# Patient Record
Sex: Male | Born: 1960 | Race: White | Hispanic: No | Smoking: Never smoker
Health system: Southern US, Community
[De-identification: ages and names within clinical notes are randomized; demographics above are authoritative.]

## PROBLEM LIST (undated history)

## (undated) DIAGNOSIS — K7460 Unspecified cirrhosis of liver: Secondary | ICD-10-CM

## (undated) DIAGNOSIS — K219 Gastro-esophageal reflux disease without esophagitis: Secondary | ICD-10-CM

## (undated) HISTORY — PX: ESOPHAGEAL DILATION: SHX303

## (undated) HISTORY — DX: Unspecified cirrhosis of liver: K74.60

## (undated) HISTORY — PX: BACK SURGERY: SHX140

## (undated) HISTORY — PX: CORONARY ANGIOPLASTY: SHX604

---

## 1998-03-31 ENCOUNTER — Emergency Department (HOSPITAL_COMMUNITY): Admission: EM | Admit: 1998-03-31 | Discharge: 1998-03-31 | Payer: Self-pay | Admitting: Emergency Medicine

## 1998-04-08 ENCOUNTER — Observation Stay (HOSPITAL_COMMUNITY): Admission: RE | Admit: 1998-04-08 | Discharge: 1998-04-09 | Payer: Self-pay | Admitting: Orthopedic Surgery

## 2005-01-27 ENCOUNTER — Observation Stay (HOSPITAL_COMMUNITY): Admission: RE | Admit: 2005-01-27 | Discharge: 2005-01-28 | Payer: Self-pay | Admitting: Orthopedic Surgery

## 2005-01-31 ENCOUNTER — Inpatient Hospital Stay (HOSPITAL_COMMUNITY): Admission: EM | Admit: 2005-01-31 | Discharge: 2005-02-02 | Payer: Self-pay | Admitting: Emergency Medicine

## 2017-11-11 ENCOUNTER — Other Ambulatory Visit: Payer: Self-pay

## 2017-11-11 ENCOUNTER — Encounter (HOSPITAL_COMMUNITY): Payer: Self-pay | Admitting: Oncology

## 2017-11-11 ENCOUNTER — Ambulatory Visit (HOSPITAL_COMMUNITY)
Admission: EM | Admit: 2017-11-11 | Discharge: 2017-11-12 | Disposition: A | Discharge status: Home / Self Care | Payer: Managed Care, Other (non HMO) | Attending: Emergency Medicine | Admitting: Emergency Medicine

## 2017-11-11 DIAGNOSIS — T18128A Food in esophagus causing other injury, initial encounter: Secondary | ICD-10-CM | POA: Diagnosis present

## 2017-11-11 DIAGNOSIS — Z79899 Other long term (current) drug therapy: Secondary | ICD-10-CM | POA: Insufficient documentation

## 2017-11-11 DIAGNOSIS — X58XXXA Exposure to other specified factors, initial encounter: Secondary | ICD-10-CM | POA: Insufficient documentation

## 2017-11-11 DIAGNOSIS — K449 Diaphragmatic hernia without obstruction or gangrene: Secondary | ICD-10-CM | POA: Diagnosis not present

## 2017-11-11 DIAGNOSIS — K2970 Gastritis, unspecified, without bleeding: Secondary | ICD-10-CM | POA: Insufficient documentation

## 2017-11-11 DIAGNOSIS — Z9861 Coronary angioplasty status: Secondary | ICD-10-CM | POA: Diagnosis not present

## 2017-11-11 DIAGNOSIS — K210 Gastro-esophageal reflux disease with esophagitis: Secondary | ICD-10-CM | POA: Insufficient documentation

## 2017-11-11 DIAGNOSIS — K222 Esophageal obstruction: Secondary | ICD-10-CM | POA: Diagnosis present

## 2017-11-11 NOTE — ED Triage Notes (Signed)
Pt bib [facility] from home d/t foreign body in throat.  Per EMS pt had swallowed a piece of steak wrong.  Pt has hx of esophageal dilation.  Pt is vomiting water.  Pt can speak in full sentences.

## 2017-11-12 ENCOUNTER — Emergency Department (HOSPITAL_COMMUNITY): Payer: Managed Care, Other (non HMO) | Admitting: Certified Registered"

## 2017-11-12 ENCOUNTER — Encounter (HOSPITAL_COMMUNITY)
Admission: EM | Disposition: A | Discharge status: Home / Self Care | Payer: Self-pay | Source: Home / Self Care | Attending: Emergency Medicine

## 2017-11-12 ENCOUNTER — Encounter (HOSPITAL_COMMUNITY): Payer: Self-pay | Admitting: *Deleted

## 2017-11-12 HISTORY — PX: ESOPHAGOGASTRODUODENOSCOPY (EGD) WITH PROPOFOL: SHX5813

## 2017-11-12 LAB — I-STAT CHEM 8, ED
BUN: 6 mg/dL (ref 6–20)
CALCIUM ION: 1.08 mmol/L — AB (ref 1.15–1.40)
Chloride: 106 mmol/L (ref 101–111)
Creatinine, Ser: 0.9 mg/dL (ref 0.61–1.24)
Glucose, Bld: 95 mg/dL (ref 65–99)
HCT: 48 % (ref 39.0–52.0)
Hemoglobin: 16.3 g/dL (ref 13.0–17.0)
Potassium: 3.7 mmol/L (ref 3.5–5.1)
SODIUM: 145 mmol/L (ref 135–145)
TCO2: 22 mmol/L (ref 22–32)

## 2017-11-12 SURGERY — ESOPHAGOGASTRODUODENOSCOPY (EGD) WITH PROPOFOL
Anesthesia: Monitor Anesthesia Care

## 2017-11-12 MED ORDER — SODIUM CHLORIDE 0.9 % IV SOLN: INTRAVENOUS | Status: DC

## 2017-11-12 MED ORDER — PROPOFOL 10 MG/ML IV BOLUS
INTRAVENOUS | Status: DC | PRN
  Administered 2017-11-12: 20 mg via INTRAVENOUS
  Administered 2017-11-12 (×2): 10 mg via INTRAVENOUS
  Administered 2017-11-12 (×2): 20 mg via INTRAVENOUS
  Administered 2017-11-12: 30 mg via INTRAVENOUS

## 2017-11-12 MED ORDER — LACTATED RINGERS IV SOLN
INTRAVENOUS | Status: DC
  Administered 2017-11-12: 10:00:00 via INTRAVENOUS

## 2017-11-12 MED ORDER — GLUCAGON HCL RDNA (DIAGNOSTIC) 1 MG IJ SOLR
1.0000 mg | Freq: Once | INTRAMUSCULAR | Status: AC
  Administered 2017-11-12: 1 mg via INTRAVENOUS
  Filled 2017-11-12: qty 1

## 2017-11-12 MED ORDER — ONDANSETRON HCL 4 MG/2ML IJ SOLN
4.0000 mg | Freq: Once | INTRAMUSCULAR | Status: AC
  Administered 2017-11-12: 4 mg via INTRAVENOUS
  Filled 2017-11-12: qty 2

## 2017-11-12 MED ORDER — SODIUM CHLORIDE 0.9 % IV BOLUS (SEPSIS)
500.0000 mL | Freq: Once | INTRAVENOUS | Status: AC
  Administered 2017-11-12: 500 mL via INTRAVENOUS

## 2017-11-12 MED ORDER — PROPOFOL 500 MG/50ML IV EMUL
INTRAVENOUS | Status: DC | PRN
  Administered 2017-11-12: 75 ug/kg/min via INTRAVENOUS

## 2017-11-12 MED ORDER — NITROGLYCERIN 0.4 MG SL SUBL
0.4000 mg | SUBLINGUAL_TABLET | Freq: Once | SUBLINGUAL | Status: AC
  Administered 2017-11-12: 0.4 mg via SUBLINGUAL
  Filled 2017-11-12: qty 1

## 2017-11-12 SURGICAL SUPPLY — 15 items

## 2017-11-12 NOTE — ED Notes (Signed)
Re-paged GI 

## 2017-11-12 NOTE — ED Notes (Signed)
Pt transported to endo.  

## 2017-11-12 NOTE — Anesthesia Postprocedure Evaluation (Signed)
Anesthesia Post Note  Patient: Rodney Russell  Procedure(s) Performed: ESOPHAGOGASTRODUODENOSCOPY (EGD) WITH PROPOFOL (N/A )     Patient location during evaluation: Endoscopy Anesthesia Type: MAC Level of consciousness: awake Pain management: pain level controlled Vital Signs Assessment: post-procedure vital signs reviewed and stable Respiratory status: spontaneous breathing Cardiovascular status: stable Anesthetic complications: no    Last Vitals:  Vitals:   11/12/17 1026 11/12/17 1100  BP: (!) 146/92 (!) 142/78  Pulse: 84 89  Resp: 18 18  Temp:  36.7 C  SpO2: 94% 100%    Last Pain:  Vitals:   11/12/17 1100  TempSrc:   PainSc: 0-No pain                 Deari Sessler

## 2017-11-12 NOTE — Discharge Instructions (Signed)
Do not drive till tomorrow no sharp objects clear liquids for 2 hours and if doing well may have soft solids today and slowly advance diet tomorrow but take small bites and take time eating and chew food well and drink plenty of liquids

## 2017-11-12 NOTE — Op Note (Signed)
[facility] Patient Name: Rodney Russell Procedure Date : 11/12/2017 MRN: [id number] Attending MD: Vida Rigger , MD Date of Birth: Dec 17, 1960 CSN: [id number] Age: 57 Admit Type: Emergency Department Procedure:                Upper GI endoscopy Indications:              Dysphagia, Foreign body in the esophagus Providers:                Vida Rigger, MD, Roselie Awkward, RN, Madalyn Rob,                            Technician, Judithann Sauger, Technician, Dionne Bucy, CRNA Referring MD:              Medicines:                Propofol total dose 220 mg IV Complications:            No immediate complications. Estimated Blood Loss:     Estimated blood loss: none. Procedure:                Pre-Anesthesia Assessment:                           - Prior to the procedure, a History and Physical                            was performed, and patient medications and                            allergies were reviewed. The patient's tolerance of                            previous anesthesia was also reviewed. The risks                            and benefits of the procedure and the sedation                            options and risks were discussed with the patient.                            All questions were answered, and informed consent                            was obtained. Prior Anticoagulants: The patient has                            taken no previous anticoagulant or antiplatelet                            agents. ASA Grade Assessment: I - A normal, healthy  patient. After reviewing the risks and benefits,                            the patient was deemed in satisfactory condition to                            undergo the procedure.                           - Prior to the procedure, a History and Physical                            was performed, and patient medications and                            allergies  were reviewed. The patient's tolerance of                            previous anesthesia was also reviewed. The risks                            and benefits of the procedure and the sedation                            options and risks were discussed with the patient.                            All questions were answered, and informed consent                            was obtained. Prior Anticoagulants: The patient has                            taken no previous anticoagulant or antiplatelet                            agents. ASA Grade Assessment: I - A normal, healthy                            patient. After reviewing the risks and benefits,                            the patient was deemed in satisfactory condition to                            undergo the procedure.                           After obtaining informed consent, the endoscope was                            passed under direct vision. Throughout the  procedure, the patient's blood pressure, pulse, and                            oxygen saturations were monitored continuously. The                            EG-[id number] ([id number]) scope was introduced through the                            mouth, and advanced to the second part of duodenum.                            The upper GI endoscopy was accomplished without                            difficulty. The patient tolerated the procedure                            well. Scope In: Scope Out: Findings:      A medium-sized hiatal hernia was present.      One mild benign-appearing, intrinsic stenosis was found. And was       traversed.      Moderate distal esophagitis with no bleeding was found.      Localized mild inflammation characterized by congestion (edema) and       erythema was found in the gastric antrum.      The duodenal bulb, first portion of the duodenum and second portion of       the duodenum were normal.      The exam was otherwise  without abnormality. Impression:               - Medium-sized hiatal hernia.                           - Benign-appearing esophageal stenosis.                           - Moderate distal reflux and erosive esophagitis.                           - Gastritis.                           - Normal duodenal bulb, first portion of the                            duodenum and second portion of the duodenum.                           - The examination was otherwise normal.                           - No specimens collected. Moderate Sedation:      moderate sedation-none Recommendation:           - Patient has a contact number available for  emergencies. The signs and symptoms of potential                            delayed complications were discussed with the                            patient. Return to normal activities tomorrow.                            Written discharge instructions were provided to the                            patient.                           - Clear liquid diet for 2 hours.then soft solids                            and chew food well take small bites and take time                            and drink plenty of liquids                           - Continue present medications.                           - Return to GI clinic in 2 weeks.                           - Telephone GI clinic if symptomatic PRN. Procedure Code(s):        --- Professional ---                           [phone], Esophagogastroduodenoscopy, flexible,                            transoral; diagnostic, including collection of                            specimen(s) by brushing or washing, when performed                            (separate procedure) Diagnosis Code(s):        --- Professional ---                           K44.9, Diaphragmatic hernia without obstruction or                            gangrene                           K22.2, Esophageal obstruction  K21.0, Gastro-esophageal reflux disease with                            esophagitis                           K20.8, Other esophagitis                           K29.70, Gastritis, unspecified, without bleeding                           R13.10, Dysphagia, unspecified                           T18.108A, Unspecified foreign body in esophagus                            causing other injury, initial encounter CPT copyright 2016 [employer]. All rights reserved. The codes documented in this report are preliminary and upon coder review may  be revised to meet current compliance requirements. Vida Rigger, MD 11/12/2017 10:27:47 AM This report has been signed electronically. Number of Addenda: 0

## 2017-11-12 NOTE — ED Notes (Signed)
PT angry about length of visit and delay for procedure.

## 2017-11-12 NOTE — Anesthesia Preprocedure Evaluation (Addendum)
Anesthesia Evaluation  Patient identified by MRN, date of birth, ID band Patient awake    Reviewed: Allergy & Precautions, NPO status , Patient's Chart, lab work & pertinent test results  Airway Mallampati: II  TM Distance: >3 FB     Dental   Pulmonary neg pulmonary ROS,    breath sounds clear to auscultation       Cardiovascular + CAD   Rhythm:Regular Rate:Normal  History noted. CG   Neuro/Psych    GI/Hepatic Neg liver ROS, History noted. CG   Endo/Other  negative endocrine ROS  Renal/GU negative Renal ROS     Musculoskeletal   Abdominal   Peds  Hematology   Anesthesia Other Findings   Reproductive/Obstetrics                            Anesthesia Physical Anesthesia Plan  ASA: III  Anesthesia Plan: MAC   Post-op Pain Management:    Induction: Intravenous  PONV Risk Score and Plan: 2 and Treatment may vary due to age or medical condition  Airway Management Planned: Simple Face Mask, Mask and Nasal Cannula  Additional Equipment:   Intra-op Plan:   Post-operative Plan:   Informed Consent: I have reviewed the patients History and Physical, chart, labs and discussed the procedure including the risks, benefits and alternatives for the proposed anesthesia with the patient or authorized representative who has indicated his/her understanding and acceptance.   Dental advisory given  Plan Discussed with: CRNA and Anesthesiologist  Anesthesia Plan Comments:        Anesthesia Quick Evaluation

## 2017-11-12 NOTE — ED Provider Notes (Signed)
[facility]   CSN: [id number] Arrival date & time: 11/11/17  2341     History   Chief Complaint Chief Complaint  Patient presents with  . Aspiration    HPI Rodney Russell is a 57 y.o. male.  57 yo M with a chief complaint of a food impaction.  Patient said he had a large piece of steak and felt to get stuck.  He has had some vomiting but has only gotten clear things up but no food.  He feels that if someone does the Heimlich to him that he would feel better.  He has had to have his esophagus dilated in the past.  This was done in [place], patient has tried to drink water without avail.  He also tried to take Zantac but he was unable to keep it down.  He has been on Prilosec chronically but has been taking it every other day.   The history is provided by the patient and the spouse.  Illness  This is a new problem. The current episode started 3 to 5 hours ago. The problem occurs constantly. The problem has not changed since onset.Pertinent negatives include no chest pain, no abdominal pain, no headaches and no shortness of breath. Nothing aggravates the symptoms. Nothing relieves the symptoms. He has tried nothing for the symptoms. The treatment provided no relief.    History reviewed. No pertinent past medical history.  There are no active problems to display for this patient.   Past Surgical History:  Procedure Laterality Date  . BACK SURGERY    . CORONARY ANGIOPLASTY    . ESOPHAGEAL DILATION     x 2       Home Medications    Prior to Admission medications   Medication Sig Start Date End Date Taking? Authorizing Provider  pantoprazole (PROTONIX) 40 MG tablet Take 40 mg by mouth daily.   Yes [provider]    Family History No family history on file.  Social History Social History   Tobacco Use  . Smoking status: Never Smoker  . Smokeless tobacco: Never Used  Substance Use Topics  . Alcohol  use: Yes  . Drug use: No     Allergies   Patient has no known allergies.   Review of Systems Review of Systems  Constitutional: Negative for chills and fever.  HENT: Negative for congestion and facial swelling.   Eyes: Negative for discharge and visual disturbance.  Respiratory: Negative for shortness of breath.   Cardiovascular: Negative for chest pain and palpitations.  Gastrointestinal: Positive for nausea and vomiting. Negative for abdominal pain and diarrhea.  Musculoskeletal: Negative for arthralgias and myalgias.  Skin: Negative for color change and rash.  Neurological: Negative for tremors, syncope and headaches.  Psychiatric/Behavioral: Negative for confusion and dysphoric mood.     Physical Exam Updated Vital Signs BP 134/88   Pulse 88   Temp 98.1 F (36.7 C) (Oral)   Resp 14   Ht 5\' 8"  (1.727 m)   Wt 99.8 kg (220 lb)   SpO2 94%   BMI 33.45 kg/m   Physical Exam  Constitutional: He is oriented to person, place, and time. He appears well-developed and well-nourished.  HENT:  Head: Normocephalic and atraumatic.  Eyes: EOM are normal. Pupils are equal, round, and reactive to light.  Neck: Normal range of motion. Neck supple. No JVD present.  Cardiovascular: Normal rate and regular rhythm. Exam reveals no gallop and no friction rub.  No murmur heard. Pulmonary/Chest: No respiratory distress. He has no wheezes.  Abdominal: He exhibits no distension and no mass. There is no tenderness. There is no rebound and no guarding.  Musculoskeletal: Normal range of motion.  Neurological: He is alert and oriented to person, place, and time.  Skin: No rash noted. No pallor.  Psychiatric: He has a normal mood and affect. His behavior is normal.  Nursing note and vitals reviewed.    ED Treatments / Results  Labs (all labs ordered are listed, but only abnormal results are displayed) Labs Reviewed - No data to display  EKG  EKG Interpretation  Date/Time:  Saturday  November 11 2017 23:46:08 EST Ventricular Rate:  72 PR Interval:    QRS Duration: 94 QT Interval:  397 QTC Calculation: 435 R Axis:   -85 Text Interpretation:  Sinus rhythm Borderline prolonged PR interval Left anterior fascicular block Abnormal R-wave progression, late transition Baseline wander in lead(s) II No significant change since last tracing Confirmed by Melene Plan [phone]) on 11/12/2017 12:00:28 AM       Radiology No results found.  Procedures Procedures (including critical care time)  Medications Ordered in ED Medications  nitroGLYCERIN (NITROSTAT) SL tablet 0.4 mg (0.4 mg Sublingual Given 11/12/17 0119)  glucagon (human recombinant) (GLUCAGEN) injection 1 mg (1 mg Intravenous Given 11/12/17 0119)  ondansetron (ZOFRAN) injection 4 mg (4 mg Intravenous Given 11/12/17 0119)  glucagon (human recombinant) (GLUCAGEN) injection 1 mg (1 mg Intravenous Given 11/12/17 0350)     Initial Impression / Assessment and Plan / ED Course  I have reviewed the triage vital signs and the nursing notes.  Pertinent labs & imaging results that were available during my care of the patient were reviewed by me and considered in my medical decision making (see chart for details).     57 yO M with a chief complaint of an esophageal food impaction.  Patient is well-appearing and nontoxic.  There is no airway involvement.  He has clear lung sounds.  We will give the patient some nitro glucagon and reassess.  The patient is still not able to tolerate liquids.  I discussed the case with Dr. Ewing Schlein, he has to be re-paged at 07 if the patient has not yet passed his food bolus.  The patient was reassessed and is unable to tolerate liquids.  I rediscussed the case with Dr. Ewing Schlein, he will call in the endoscopy team this morning.  The patients results and plan were reviewed and discussed.   Any x-rays performed were independently reviewed by myself.   Differential diagnosis were considered with the presenting  HPI.  Medications  nitroGLYCERIN (NITROSTAT) SL tablet 0.4 mg (0.4 mg Sublingual Given 11/12/17 0119)  glucagon (human recombinant) (GLUCAGEN) injection 1 mg (1 mg Intravenous Given 11/12/17 0119)  ondansetron (ZOFRAN) injection 4 mg (4 mg Intravenous Given 11/12/17 0119)  glucagon (human recombinant) (GLUCAGEN) injection 1 mg (1 mg Intravenous Given 11/12/17 0350)    Vitals:   11/12/17 0315 11/12/17 0500 11/12/17 0530 11/12/17 0545  BP: (!) 144/95 (!) 151/99 128/88 134/88  Pulse: 77 81 81 88  Resp: 15 (!) 26 16 14   Temp:      TempSrc:      SpO2: 95% 97% 94% 94%  Weight:      Height:        Final diagnoses:  Esophageal obstruction due to food impaction      Final Clinical Impressions(s) / ED Diagnoses   Final diagnoses:  Esophageal obstruction due  to food impaction    ED Discharge Orders    None       Melene Plan, [place] 11/12/17 [phone]

## 2017-11-12 NOTE — Transfer of Care (Signed)
Immediate Anesthesia Transfer of Care Note  Patient: Rodney Russell  Procedure(s) Performed: ESOPHAGOGASTRODUODENOSCOPY (EGD) WITH PROPOFOL (N/A )  Patient Location: Endoscopy Unit  Anesthesia Type:MAC  Level of Consciousness: awake, alert , oriented and patient cooperative  Airway & Oxygen Therapy: Patient Spontanous Breathing and Patient connected to nasal cannula oxygen  Post-op Assessment: Report given to RN, Post -op Vital signs reviewed and stable and Patient moving all extremities X 4  Post vital signs: Reviewed and stable  Last Vitals:  Vitals:   11/12/17 0900 11/12/17 0938  BP: 107/81 (!) 166/105  Pulse: 85 78  Resp: 17 14  Temp:  37.1 C  SpO2: 91% 96%    Last Pain:  Vitals:   11/12/17 0938  TempSrc: Oral  PainSc:          Complications: No apparent anesthesia complications

## 2017-11-12 NOTE — Consult Note (Signed)
Reason for Consult: Food impaction Referring Physician: ER physician  Rodney Russell is an 57 y.o. male.  HPI: Patient seen and examined and he has a long history of swallowing problems and has had dilation a few times before and the last one was about 3 years ago and has had food impactions before but is always been able to force himself to vomit and get it up but this time he has been unable in the ER tried all the usual medicines without success and he has been on a stomach medicine but no other medical issues or medicines and has no known allergies  History reviewed. No pertinent past medical history.  Past Surgical History:  Procedure Laterality Date  . BACK SURGERY    . CORONARY ANGIOPLASTY    . ESOPHAGEAL DILATION     x 2    History reviewed. No pertinent family history.  Social History:  reports that  has never smoked. he has never used smokeless tobacco. He reports that he drinks alcohol. He reports that he does not use drugs.  Allergies: No Known Allergies  Medications: I have reviewed the patient's current medications.  Results for orders placed or performed during the hospital encounter of 11/11/17 (from the past 48 hour(s))  I-stat Chem 8, ED     Status: Abnormal   Collection Time: 11/12/17  9:29 AM  Result Value Ref Range   Sodium 145 135 - 145 mmol/L   Potassium 3.7 3.5 - 5.1 mmol/L   Chloride 106 101 - 111 mmol/L   BUN 6 6 - 20 mg/dL   Creatinine, Ser 5.630.90 0.61 - 1.24 mg/dL   Glucose, Bld 95 65 - 99 mg/dL   Calcium, Ion 8.751.08 (L) 1.15 - 1.40 mmol/L   TCO2 22 22 - 32 mmol/L   Hemoglobin 16.3 13.0 - 17.0 g/dL   HCT 64.348.0 32.939.0 - 51.852.0 %    No results found.  ROS negative except above Blood pressure (!) 166/105, pulse 78, temperature 98.7 F (37.1 C), temperature source Oral, resp. rate 14, height 5\' 8"  (1.727 m), weight 99.8 kg (220 lb), SpO2 96 %. Physical Exam vital signs stable afebrile no acute distress exam please see preassessment  evaluation  Assessment/Plan: Impaction in a patient with history of dilations in the past Plan: The risks benefits and methods of endoscopy was discussed with the patient and his sister and will proceed with anesthesia assistance this morning with further workup and plans pending those findings  Hideo Googe E 11/12/2017, 9:52 AM

## 2017-11-14 ENCOUNTER — Encounter (HOSPITAL_COMMUNITY): Payer: Self-pay | Admitting: Gastroenterology

## 2018-12-22 ENCOUNTER — Emergency Department: Payer: Self-pay | Admitting: Anesthesiology

## 2018-12-22 ENCOUNTER — Encounter
Admission: EM | Disposition: A | Discharge status: Home / Self Care | Payer: Self-pay | Source: Home / Self Care | Attending: Emergency Medicine

## 2018-12-22 ENCOUNTER — Other Ambulatory Visit: Payer: Self-pay

## 2018-12-22 ENCOUNTER — Emergency Department: Payer: Self-pay

## 2018-12-22 ENCOUNTER — Encounter: Payer: Self-pay | Admitting: Emergency Medicine

## 2018-12-22 ENCOUNTER — Emergency Department
Admission: EM | Admit: 2018-12-22 | Discharge: 2018-12-22 | Disposition: A | Discharge status: Home / Self Care | Payer: Self-pay | Attending: Emergency Medicine | Admitting: Emergency Medicine

## 2018-12-22 DIAGNOSIS — Z79899 Other long term (current) drug therapy: Secondary | ICD-10-CM | POA: Insufficient documentation

## 2018-12-22 DIAGNOSIS — K449 Diaphragmatic hernia without obstruction or gangrene: Secondary | ICD-10-CM | POA: Insufficient documentation

## 2018-12-22 DIAGNOSIS — T18108A Unspecified foreign body in esophagus causing other injury, initial encounter: Secondary | ICD-10-CM

## 2018-12-22 DIAGNOSIS — Z791 Long term (current) use of non-steroidal anti-inflammatories (NSAID): Secondary | ICD-10-CM | POA: Insufficient documentation

## 2018-12-22 DIAGNOSIS — T18128A Food in esophagus causing other injury, initial encounter: Secondary | ICD-10-CM | POA: Insufficient documentation

## 2018-12-22 DIAGNOSIS — I2510 Atherosclerotic heart disease of native coronary artery without angina pectoris: Secondary | ICD-10-CM | POA: Insufficient documentation

## 2018-12-22 DIAGNOSIS — K210 Gastro-esophageal reflux disease with esophagitis: Secondary | ICD-10-CM | POA: Insufficient documentation

## 2018-12-22 DIAGNOSIS — K222 Esophageal obstruction: Secondary | ICD-10-CM | POA: Insufficient documentation

## 2018-12-22 DIAGNOSIS — X58XXXA Exposure to other specified factors, initial encounter: Secondary | ICD-10-CM | POA: Insufficient documentation

## 2018-12-22 HISTORY — DX: Gastro-esophageal reflux disease without esophagitis: K21.9

## 2018-12-22 HISTORY — PX: ESOPHAGOGASTRODUODENOSCOPY: SHX5428

## 2018-12-22 SURGERY — EGD (ESOPHAGOGASTRODUODENOSCOPY)
Anesthesia: General

## 2018-12-22 MED ORDER — ONDANSETRON HCL 4 MG/2ML IJ SOLN
INTRAMUSCULAR | Status: DC | PRN
  Administered 2018-12-22: 4 mg via INTRAVENOUS

## 2018-12-22 MED ORDER — SODIUM CHLORIDE 0.9 % IV SOLN
Freq: Once | INTRAVENOUS | Status: AC
  Administered 2018-12-22: 18:00:00 via INTRAVENOUS

## 2018-12-22 MED ORDER — FENTANYL CITRATE (PF) 100 MCG/2ML IJ SOLN: 25.0000 ug | INTRAMUSCULAR | Status: DC | PRN

## 2018-12-22 MED ORDER — IPRATROPIUM-ALBUTEROL 0.5-2.5 (3) MG/3ML IN SOLN
RESPIRATORY_TRACT | Status: AC
  Administered 2018-12-22: 19:00:00 3 mL via RESPIRATORY_TRACT
  Filled 2018-12-22: qty 3

## 2018-12-22 MED ORDER — LIDOCAINE HCL (CARDIAC) PF 100 MG/5ML IV SOSY
PREFILLED_SYRINGE | INTRAVENOUS | Status: DC | PRN
  Administered 2018-12-22: 100 mg via INTRAVENOUS

## 2018-12-22 MED ORDER — LIDOCAINE HCL (PF) 2 % IJ SOLN
INTRAMUSCULAR | Status: AC
  Filled 2018-12-22: qty 10

## 2018-12-22 MED ORDER — OXYCODONE HCL 5 MG PO TABS: 5.0000 mg | ORAL_TABLET | Freq: Once | ORAL | Status: DC | PRN

## 2018-12-22 MED ORDER — OMEPRAZOLE 40 MG PO CPDR: 40.0000 mg | DELAYED_RELEASE_CAPSULE | Freq: Every day | ORAL | 2 refills | Status: DC

## 2018-12-22 MED ORDER — IPRATROPIUM-ALBUTEROL 0.5-2.5 (3) MG/3ML IN SOLN
3.0000 mL | RESPIRATORY_TRACT | Status: DC
  Administered 2018-12-22: 3 mL via RESPIRATORY_TRACT

## 2018-12-22 MED ORDER — SUCCINYLCHOLINE CHLORIDE 20 MG/ML IJ SOLN
INTRAMUSCULAR | Status: DC | PRN
  Administered 2018-12-22: 200 mg via INTRAVENOUS

## 2018-12-22 MED ORDER — FENTANYL CITRATE (PF) 100 MCG/2ML IJ SOLN
INTRAMUSCULAR | Status: DC | PRN
  Administered 2018-12-22: 50 ug via INTRAVENOUS

## 2018-12-22 MED ORDER — SUCCINYLCHOLINE CHLORIDE 20 MG/ML IJ SOLN
INTRAMUSCULAR | Status: AC
Start: 2018-12-22 — End: ?
  Filled 2018-12-22: qty 1

## 2018-12-22 MED ORDER — OXYCODONE HCL 5 MG/5ML PO SOLN: 5.0000 mg | Freq: Once | ORAL | Status: DC | PRN

## 2018-12-22 MED ORDER — LIDOCAINE VISCOUS HCL 2 % MT SOLN
15.0000 mL | Freq: Once | OROMUCOSAL | Status: AC
  Administered 2018-12-22: 15 mL via ORAL
  Filled 2018-12-22: qty 15

## 2018-12-22 MED ORDER — DEXAMETHASONE SODIUM PHOSPHATE 10 MG/ML IJ SOLN
INTRAMUSCULAR | Status: DC | PRN
  Administered 2018-12-22: 10 mg via INTRAVENOUS

## 2018-12-22 MED ORDER — FENTANYL CITRATE (PF) 100 MCG/2ML IJ SOLN
INTRAMUSCULAR | Status: AC
  Filled 2018-12-22: qty 2

## 2018-12-22 MED ORDER — PROPOFOL 10 MG/ML IV BOLUS
INTRAVENOUS | Status: DC | PRN
  Administered 2018-12-22: 50 mg via INTRAVENOUS
  Administered 2018-12-22: 180 mg via INTRAVENOUS

## 2018-12-22 MED ORDER — ALUM & MAG HYDROXIDE-SIMETH 200-200-20 MG/5ML PO SUSP
30.0000 mL | Freq: Once | ORAL | Status: AC
  Administered 2018-12-22: 14:00:00 30 mL via ORAL
  Filled 2018-12-22: qty 30

## 2018-12-22 MED ORDER — ROCURONIUM BROMIDE 50 MG/5ML IV SOLN
INTRAVENOUS | Status: AC
  Filled 2018-12-22: qty 1

## 2018-12-22 MED ORDER — SODIUM CHLORIDE 0.9 % IV SOLN
INTRAVENOUS | Status: DC | PRN
  Administered 2018-12-22: 18:00:00 via INTRAVENOUS

## 2018-12-22 NOTE — Consult Note (Addendum)
Arlyss Repressohini R Vanga, MD [address]  [address]  [place], [place] [address]  Main: [phone]  Fax: [phone] Pager: [phone]   Consultation  Referring Provider:     No ref. provider found Primary Care Physician:  System, Pcp Not In Primary Gastroenterologist:  Dr. Norma Fredricksonoledo         Reason for Consultation:     Food impaction  Date of Admission:  12/22/2018 Date of Consultation:  12/22/2018         HPI:   Rodney Russell is a 58 y.o. male with history of hiatal hernia, esophagitis, esophageal strictures, status post dilation in the past, had food bolus impaction in the past who ate a piece of steak at 6 AM today.  He generally tries to chew thoroughly when eating hard meats like deer meat or steak but, this morning he was rushing up to work, swallowed a piece of steak without chewing it well.  He tried to get it down at home by drinking warm Coke without success.  He tried to drink liquids, unable to keep them down.  He is able to swallow secretions, protecting his airway.  Patient reports that he had esophageal dilation about 7 years ago, food impaction echo 10 years ago which passed while doing the endoscopy.  Patient reports taking Protonix every other day, he currently does not have insurance and therefore trying to manage with the medication that he is carrying.  GI is consulted for an urgent EGD for food impaction  NSAIDs: Mobic  Antiplts/Anticoagulants/Anti thrombotics: None  GI Procedures: EGD in 11/2017 - Medium-sized hiatal hernia. - Benign-appearing esophageal stenosis. - Moderate distal reflux and erosive esophagitis. - Gastritis. - Normal duodenal bulb, first portion of the duodenum and second portion of the duodenum. - The examination was otherwise normal. - No specimens collected.  Past Medical History:  Diagnosis Date  . GERD (gastroesophageal reflux disease)     Past Surgical History:  Procedure Laterality Date  . BACK SURGERY    . CORONARY  ANGIOPLASTY    . ESOPHAGEAL DILATION     x 2  . ESOPHAGOGASTRODUODENOSCOPY (EGD) WITH PROPOFOL N/A 11/12/2017   Procedure: ESOPHAGOGASTRODUODENOSCOPY (EGD) WITH PROPOFOL;  Surgeon: Vida RiggerMagod, Marc, MD;  [facility];  Service: Endoscopy;  Laterality: N/A;    Prior to Admission medications   Medication Sig Start Date End Date Taking? Authorizing Provider  cyclobenzaprine (FLEXERIL) 10 MG tablet Take 10 mg by mouth 3 (three) times daily. 10/25/18   [provider]  meloxicam (MOBIC) 15 MG tablet Take 15 mg by mouth daily. 10/25/18   [provider]  pantoprazole (PROTONIX) 40 MG tablet Take 40 mg by mouth daily.    [provider]  No current facility-administered medications for this encounter.    History reviewed. No pertinent family history.   Social History   Tobacco Use  . Smoking status: Never Smoker  . Smokeless tobacco: Never Used  Substance Use Topics  . Alcohol use: Yes  . Drug use: No    Allergies as of 12/22/2018  . (No Known Allergies)    Review of Systems:    All systems reviewed and negative except where noted in HPI.   Physical Exam:  Vital signs in last 24 hours: Temp:  [98.9 F (37.2 C)] 98.9 F (37.2 C) (04/18 1358) Pulse Rate:  [88-98] 88 (04/18 1700) Resp:  [18] 18 (04/18 1358) BP: (154-166)/(106-112) 158/106 (04/18 1700) SpO2:  [93 %-97 %] 93 % (04/18  1700) Weight:  [99.3 kg] 99.3 kg (04/18 1354)   General:   Pleasant, cooperative in NAD Head:  Normocephalic and atraumatic. Eyes:   No icterus.   Conjunctiva pink. PERRLA. Ears:  Normal auditory acuity. Neck:  Supple; no masses or thyroidomegaly Lungs: Respirations even and unlabored. Lungs clear to auscultation bilaterally.   No wheezes, crackles, or rhonchi.  Heart:  Regular rate and rhythm;  Without murmur, clicks, rubs or gallops Abdomen:  Soft, nondistended, nontender. Normal bowel sounds. No appreciable masses or hepatomegaly.  No rebound or guarding.  Rectal:   Not performed. Msk:  Symmetrical without gross deformities.  Strength normal  Extremities:  Without edema, cyanosis or clubbing. Neurologic:  Alert and oriented x3;  grossly normal neurologically. Skin:  Intact without significant lesions or rashes. Psych:  Alert and cooperative. Normal affect.  LAB RESULTS: CBC Latest Ref Rng & Units 11/12/2017  Hemoglobin 13.0 - 17.0 g/dL 03.5  Hematocrit 00.9 - 52.0 % 48.0    BMET BMP Latest Ref Rng & Units 11/12/2017  Glucose 65 - 99 mg/dL 95  BUN 6 - 20 mg/dL 6  Creatinine 3.81 - 8.29 mg/dL 9.37  Sodium 169 - 678 mmol/L 145  Potassium 3.5 - 5.1 mmol/L 3.7  Chloride 101 - 111 mmol/L 106    LFT No flowsheet data found.   STUDIES: Dg Neck Soft Tissue  Result Date: 12/22/2018 CLINICAL DATA:  Steak stuck in throat. EXAM: NECK SOFT TISSUES - 1+ VIEW COMPARISON:  None. FINDINGS: Two-view exam shows no prevertebral soft tissue swelling. No gas evident within the prevertebral soft tissues. Epiglottis and aryepiglottic folds are unremarkable. Prominent anterior osteophytes around the C3-4 disc space anteriorly generate mass-effect on the posterior hypopharynx. IMPRESSION: Degenerative spurring in the upper cervical spine generates mass-effect on the posterior hypopharynx. Otherwise unremarkable. Electronically Signed   By: Kennith Center M.D.   On: 12/22/2018 15:13      Impression / Plan:   Rodney Russell is a 58 y.o. male with known history of esophagitis, esophageal stricture and hiatal hernia, prior food bolus impactions presented to ER with food impaction after eating piece of steak earlier today  Recommend urgent EGD with intubation Avoid NSAIDs Continue long-term PPI twice daily  I have discussed alternative options, risks & benefits,  which include, but are not limited to, bleeding, infection, perforation,respiratory complication & drug reaction.  The patient agrees with this plan & written consent will be obtained.     Thank you for  involving me in the care of this patient.      LOS: 0 days   Lannette Donath, MD  12/22/2018, 5:43 PM   Note: This dictation was prepared with Dragon dictation along with smaller phrase technology. Any transcriptional errors that result from this process are unintentional.

## 2018-12-22 NOTE — Anesthesia Postprocedure Evaluation (Signed)
Anesthesia Post Note  Patient: Rodney Russell  Procedure(s) Performed: ESOPHAGOGASTRODUODENOSCOPY (EGD) (N/A )  Patient location during evaluation: PACU Anesthesia Type: General Level of consciousness: awake and alert Pain management: pain level controlled Vital Signs Assessment: post-procedure vital signs reviewed and stable Respiratory status: spontaneous breathing, nonlabored ventilation and respiratory function stable Cardiovascular status: blood pressure returned to baseline and stable Postop Assessment: no apparent nausea or vomiting Anesthetic complications: no Comments: Patient saturating 96% while I was talking to him at time of this note.  He was 93% pre Op.  He reports no shortness of breath at this time.  Patient instructed to contact a doctor immediately or call 911 if he develops any new shortness of breath, chest pain or has other concerns.     Last Vitals:  Vitals:   12/22/18 1833 12/22/18 1848  BP: 132/88 128/88  Pulse: 89 91  Resp: 16 15  Temp:    SpO2: 93% 91%    Last Pain:  Vitals:   12/22/18 1848  TempSrc:   PainSc: 0-No pain                 Cleda Mccreedy Rona Tomson

## 2018-12-22 NOTE — ED Notes (Signed)
Pt taken to xray via stretcher  

## 2018-12-22 NOTE — Anesthesia Post-op Follow-up Note (Signed)
Anesthesia QCDR form completed.        

## 2018-12-22 NOTE — ED Triage Notes (Signed)
Pt got piece steak stuck in esophagus at 6 AM. Has tried to get to go down at home without success. Hx of same with endoscopy for same.  Maintaining airway. Liquids come back up.

## 2018-12-22 NOTE — Op Note (Signed)
[facility] Gastroenter[facility] Patient Name: Rodney Russell Procedure Date: 12/22/2018 5:45 PM MRN: [id number] Account #: [id number] Date of Birth: 01-14-1961 Admit Type: Inpatient Age: 58 Room: [facility] ENDO ROOM 4 Gender: Male Note Status: Finalized Procedure:            Upper GI endoscopy Indications:          Esophageal dysphagia Providers:            Toney Reil MD, MD Medicines:            See the Anesthesia note for documentation of the                        administered medications, General Anesthesia Complications:        No immediate complications. Estimated blood loss: None. Procedure:            Pre-Anesthesia Assessment:                       - Prior to the procedure, a History and Physical was                        performed, and patient medications and allergies were                        reviewed. The patient is competent. The risks and                        benefits of the procedure and the sedation options and                        risks were discussed with the patient. All questions                        were answered and informed consent was obtained.                        Patient identification and proposed procedure were                        verified by the physician, the nurse, the                        anesthesiologist, the anesthetist and the technician in                        the pre-procedure area in the procedure room in the                        endoscopy suite. Mental Status Examination: alert and                        oriented. Airway Examination: normal oropharyngeal                        airway and neck mobility. Respiratory Examination:                        clear to auscultation. CV Examination: normal.  Prophylactic Antibiotics: The patient does not require                        prophylactic antibiotics. Prior Anticoagulants: The                        patient has taken no previous  anticoagulant or                        antiplatelet agents. ASA Grade Assessment: III - A                        patient with severe systemic disease. After reviewing                        the risks and benefits, the patient was deemed in                        satisfactory condition to undergo the procedure. The                        anesthesia plan was to use general anesthesia.                        Immediately prior to administration of medications, the                        patient was re-assessed for adequacy to receive                        sedatives. The heart rate, respiratory rate, oxygen                        saturations, blood pressure, adequacy of pulmonary                        ventilation, and response to care were monitored                        throughout the procedure. The physical status of the                        patient was re-assessed after the procedure.                       After obtaining informed consent, the endoscope was                        passed under direct vision. Throughout the procedure,                        the patient's blood pressure, pulse, and oxygen                        saturations were monitored continuously. The Endoscope                        was introduced through the mouth, and advanced to the  second part of duodenum. The upper GI endoscopy was                        accomplished without difficulty. The patient tolerated                        the procedure well. Findings:      Food was found in the lower third of the esophagus. Removal of food was       accomplished, pushed into stomach.      LA Grade C (one or more mucosal breaks continuous between tops of 2 or       more mucosal folds, less than 75% circumference) esophagitis with no       bleeding was found in the lower third of the esophagus.      The entire examined stomach was normal.      A medium-sized hiatal hernia was present.      The  duodenal bulb and second portion of the duodenum were normal. Impression:           - Food in the lower third of the esophagus. Removal was                        successful.                       - LA Grade C reflux esophagitis.                       - Normal stomach.                       - Medium-sized hiatal hernia.                       - Normal duodenal bulb and second portion of the                        duodenum. Recommendation:       - Discharge patient to home (with escort).                       - Chopped diet for the rest of the patient's life.                       - Follow an antireflux regimen.                       - Use Prilosec (omeprazole) 40 mg PO daily indefinitely.                       - Await pathology results.                       - Follow up Dr Norma Fredricksonoledo Procedure Code(s):    --- Professional ---                       [phone], Esophagogastroduodenoscopy, flexible, transoral;                        with removal of foreign body(s) Diagnosis Code(s):    --- Professional ---  G50.037C, Food in esophagus causing other injury,                        initial encounter                       K21.0, Gastro-esophageal reflux disease with esophagitis                       K44.9, Diaphragmatic hernia without obstruction or                        gangrene                       R13.14, Dysphagia, pharyngoesophageal phase CPT copyright 2019 [employer]. All rights reserved. The codes documented in this report are preliminary and upon coder review may  be revised to meet current compliance requirements. Dr. Libby Maw Toney Reil MD, MD 12/22/2018 6:14:26 PM This report has been signed electronically. Number of Addenda: 0 Note Initiated On: 12/22/2018 5:45 PM      [facility]

## 2018-12-22 NOTE — ED Notes (Signed)
Pt presents to ED c/o of steak stuck in throat since 6am this AM. Hx of same. States tried drinking warm coke and was unable to keep it down. Talking, oxygen saturations in high 90's. No resp distress noted at this time.

## 2018-12-22 NOTE — Progress Notes (Signed)
Will continue to monitor oxygen level and hopeful discharge.  Will notify Dr. Randa Ngo.

## 2018-12-22 NOTE — ED Notes (Addendum)
Pt hit call bell to inform this RN that medications had come back up. Informed provider.

## 2018-12-22 NOTE — Transfer of Care (Signed)
Immediate Anesthesia Transfer of Care Note  Patient: Rodney Russell  Procedure(s) Performed: ESOPHAGOGASTRODUODENOSCOPY (EGD) (N/A )  Patient Location: PACU  Anesthesia Type:General  Level of Consciousness: awake  Airway & Oxygen Therapy: Patient Spontanous Breathing  Post-op Assessment: Report given to RN and Post -op Vital signs reviewed and stable  Post vital signs: Reviewed and stable  Last Vitals:  Vitals Value Taken Time  BP 132/91 12/22/2018  6:18 PM  Temp    Pulse 99 12/22/2018  6:18 PM  Resp 24 12/22/2018  6:18 PM  SpO2 91 % 12/22/2018  6:18 PM  Vitals shown include unvalidated device data.  Last Pain:  Vitals:   12/22/18 1358  TempSrc: Oral  PainSc:          Complications: No apparent anesthesia complications

## 2018-12-22 NOTE — Anesthesia Preprocedure Evaluation (Signed)
Anesthesia Evaluation  Patient identified by MRN, date of birth, ID band Patient awake    Reviewed: Allergy & Precautions, H&P , NPO status , Patient's Chart, lab work & pertinent test results  History of Anesthesia Complications Negative for: history of anesthetic complications  Airway Mallampati: II  TM Distance: >3 FB Neck ROM: limited    Dental  (+) Chipped, Poor Dentition, Missing   Pulmonary neg pulmonary ROS, neg shortness of breath,           Cardiovascular Exercise Tolerance: Good (-) angina+ CAD  (-) Past MI      Neuro/Psych negative neurological ROS  negative psych ROS   GI/Hepatic Neg liver ROS, GERD  Medicated and Controlled,  Endo/Other  negative endocrine ROS  Renal/GU      Musculoskeletal   Abdominal   Peds  Hematology negative hematology ROS (+)   Anesthesia Other Findings Food bolus stuck  Past Medical History: No date: GERD (gastroesophageal reflux disease)  Past Surgical History: No date: BACK SURGERY No date: CORONARY ANGIOPLASTY No date: ESOPHAGEAL DILATION     Comment:  x 2 11/12/2017: ESOPHAGOGASTRODUODENOSCOPY (EGD) WITH PROPOFOL; N/A     Comment:  Procedure: ESOPHAGOGASTRODUODENOSCOPY (EGD) WITH               PROPOFOL;  Surgeon: Vida Rigger, MD;  Location: [facility]               ENDOSCOPY;  Service: Endoscopy;  Laterality: N/A;  BMI    Body Mass Index:  33.30 kg/m      Reproductive/Obstetrics negative OB ROS                             Anesthesia Physical Anesthesia Plan  ASA: IV and emergent  Anesthesia Plan: General ETT, Rapid Sequence and Cricoid Pressure   Post-op Pain Management:    Induction: Intravenous  PONV Risk Score and Plan: Ondansetron, Dexamethasone, Midazolam and Treatment may vary due to age or medical condition  Airway Management Planned: Oral ETT  Additional Equipment:   Intra-op Plan:   Post-operative Plan: Extubation in  OR  Informed Consent: I have reviewed the patients History and Physical, chart, labs and discussed the procedure including the risks, benefits and alternatives for the proposed anesthesia with the patient or authorized representative who has indicated his/her understanding and acceptance.     Dental Advisory Given  Plan Discussed with: Anesthesiologist, CRNA and Surgeon  Anesthesia Plan Comments: (Patient consented for risks of anesthesia including but not limited to:  - adverse reactions to medications - damage to teeth, lips or other oral mucosa - sore throat or hoarseness - Damage to heart, brain, lungs or loss of life  Patient voiced understanding.)        Anesthesia Quick Evaluation

## 2018-12-22 NOTE — Progress Notes (Signed)
Dr. Allegra Lai in to talk with patient.

## 2018-12-22 NOTE — Progress Notes (Signed)
Patients oxygen drops to 88-87 percent on room air.  DR. Piscitello aware.  Upper lobes clear to auscultation. Left lower lobe, diminished.  May give duo neb at this time and re-evaluate.

## 2018-12-22 NOTE — ED Provider Notes (Signed)
[facility] Emergency Department Provider Note  ____________________________________________  Time seen: Approximately 2:01 PM  I have reviewed the triage vital signs and the nursing notes.   HISTORY  Chief Complaint food bolus stuck in esophagus    HPI Rodney Russell is a 58 y.o. male who presents the emergency department complaining of possible food lodged in his esophagus.  Patient reports that he has had 2 esophageal retained food boluses over the past year.  Patient had his esophagus dilated twice but the last time was approximately 7 years ago.  On the 2 previous retained food boluses, both occurred after eating steak.  Patient reports that today he was eating a piece of steak when he felt it lodged in his esophagus.  Patient is tried drinking water as well as Coke and states that neither 1 of them resolved sensation.  Patient reported immediate emesis after drinking both liquids.  Patient denies any difficulty breathing at this time.  Patient denies any other complaints.  He denies any headache, neck pain, chest pain, shortness of breath abdominal pain, nausea or vomiting, diarrhea or constipation.  Patient is on chronic Protonix for GERD.  No other chronic medications.  Patient has a history of GERD, esophageal stricture.         Past Medical History:  Diagnosis Date  . GERD (gastroesophageal reflux disease)     There are no active problems to display for this patient.   Past Surgical History:  Procedure Laterality Date  . BACK SURGERY    . CORONARY ANGIOPLASTY    . ESOPHAGEAL DILATION     x 2  . ESOPHAGOGASTRODUODENOSCOPY (EGD) WITH PROPOFOL N/A 11/12/2017   Procedure: ESOPHAGOGASTRODUODENOSCOPY (EGD) WITH PROPOFOL;  Surgeon: Vida Rigger, MD;  Location: [facility] ENDOSCOPY;  Service: Endoscopy;  Laterality: N/A;    Prior to Admission medications   Medication Sig Start Date End Date Taking? Authorizing Provider  cyclobenzaprine (FLEXERIL) 10 MG  tablet Take 10 mg by mouth 3 (three) times daily. 10/25/18   [provider]  meloxicam (MOBIC) 15 MG tablet Take 15 mg by mouth daily. 10/25/18   [provider]  omeprazole (PRILOSEC) 40 MG capsule Take 1 capsule (40 mg total) by mouth daily before breakfast for 30 days. 12/22/18 01/21/19  Toney Reil, MD  pantoprazole (PROTONIX) 40 MG tablet Take 40 mg by mouth daily.    [provider]    Allergies Patient has no known allergies.  History reviewed. No pertinent family history.  Social History Social History   Tobacco Use  . Smoking status: Never Smoker  . Smokeless tobacco: Never Used  Substance Use Topics  . Alcohol use: Yes  . Drug use: No     Review of Systems  Constitutional: No fever/chills Eyes: No visual changes. No discharge ENT: Probable retained food bolus in the esophagus Cardiovascular: no chest pain. Respiratory: no cough. No SOB. Gastrointestinal: No abdominal pain.  No nausea, no vomiting.  No diarrhea.  No constipation. Musculoskeletal: Negative for musculoskeletal pain. Skin: Negative for rash, abrasions, lacerations, ecchymosis. Neurological: Negative for headaches, focal weakness or numbness. 10-point ROS otherwise negative.  ____________________________________________   PHYSICAL EXAM:  VITAL SIGNS: ED Triage Vitals  Enc Vitals Group     BP 12/22/18 1358 (!) 154/111     Pulse Rate 12/22/18 1358 98     Resp 12/22/18 1358 18     Temp 12/22/18 1358 98.9 F (37.2 C)     Temp Source 12/22/18 1358 Oral  SpO2 12/22/18 1358 97 %     Weight 12/22/18 1354 219 lb (99.3 kg)     Height 12/22/18 1354  (1.727 m)     Head Circumference --      Peak Flow --      Pain Score 12/22/18 1354 0     Pain Loc --      Pain Edu? --      Excl. in GC? --      Constitutional: Alert and oriented. Well appearing and in no acute distress. Eyes: Conjunctivae are normal. PERRL. EOMI. Head: Atraumatic. ENT:      Ears:        Nose: No congestion/rhinnorhea.      Mouth/Throat: Mucous membranes are moist.  No oropharyngeal erythema or edema.  No retained foreign body identified. Neck: No stridor.  Neck is supple full range of motion Hematological/Lymphatic/Immunilogical: No cervical lymphadenopathy. Cardiovascular: Normal rate, regular rhythm. Normal S1 and S2.  Good peripheral circulation. Respiratory: Normal respiratory effort without tachypnea or retractions. Lungs CTAB. Good air entry to the bases with no decreased or absent breath sounds. Gastrointestinal: Bowel sounds 4 quadrants. Soft and nontender to palpation. No guarding or rigidity. No palpable masses. No distention. No CVA tenderness. Musculoskeletal: Full range of motion to all extremities. No gross deformities appreciated. Neurologic:  Normal speech and language. No gross focal neurologic deficits are appreciated.  Skin:  Skin is warm, dry and intact. No rash noted. Psychiatric: Mood and affect are normal. Speech and behavior are normal. Patient exhibits appropriate insight and judgement.   ____________________________________________   LABS (all labs ordered are listed, but only abnormal results are displayed)  Labs Reviewed  SURGICAL PATHOLOGY   ____________________________________________  EKG   ____________________________________________  RADIOLOGY I personally viewed and evaluated these images as part of my medical decision making, as well as reviewing the written report by the radiologist.  Dg Neck Soft Tissue  Result Date: 12/22/2018 CLINICAL DATA:  Steak stuck in throat. EXAM: NECK SOFT TISSUES - 1+ VIEW COMPARISON:  None. FINDINGS: Two-view exam shows no prevertebral soft tissue swelling. No gas evident within the prevertebral soft tissues. Epiglottis and aryepiglottic folds are unremarkable. Prominent anterior osteophytes around the C3-4 disc space anteriorly generate mass-effect on the posterior hypopharynx. 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 Otherwise unremarkable. Electronically Signed   By: Kennith Center M.D.   On: 12/22/2018 15:13    ____________________________________________    PROCEDURES  Procedure(s) performed:    Procedures    Medications  oxyCODONE (Oxy IR/ROXICODONE) immediate release tablet 5 mg (has no administration in time range)    Or  oxyCODONE (ROXICODONE) 5 MG/5ML solution 5 mg (has no administration in time range)  fentaNYL (SUBLIMAZE) injection 25-50 mcg (has no administration in time range)  ipratropium-albuterol (DUONEB) 0.5-2.5 (3) MG/3ML nebulizer solution 3 mL (3 mLs Nebulization Given 12/22/18 1843)  alum & mag hydroxide-simeth (MAALOX/MYLANTA) 200-200-20 MG/5ML suspension 30 mL (30 mLs Oral Given 12/22/18 1415)    And  lidocaine (XYLOCAINE) 2 % viscous mouth solution 15 mL (15 mLs Oral Given 12/22/18 1415)  0.9 %  sodium chloride infusion ( Intravenous New Bag/Given 12/22/18 1749)     ____________________________________________   INITIAL IMPRESSION / ASSESSMENT AND PLAN / ED COURSE  Pertinent labs & imaging results that were available during my care of the patient were reviewed by me and considered in my medical decision making (see chart for details).  Review of the [place] CSRS  was performed in accordance of the [facility] prior to dispensing any controlled drugs.  Clinical Course as of Dec 21 1857  Sat Dec 22, 2018  1446 Patient was unable to tolerate GI cocktail with viscous lidocaine and Maalox.  Patient had immediate regurgitation following administration of medication.   [JC]    Clinical Course User Index [JC] Cuthriell, Delorise Royals, PA-C          Patient's diagnosis is consistent with likely foreign body esophagus.  Patient presented to the emergency department with foreign body sensation in the esophagus.  Patient was eating steak when he felt like this became stuck.  Patient states  that he has a history of esophageal stricture with esophageal dilation that is taking place twice in the past.  Last time was 7 years ago.  Patient has had 2 previous episodes this year of retained food bolus in his esophagus.  Patient tried warm water, warm Coke and was unable to swallow.  Patient was given GI cocktail emergency department but was unable to tolerate immediately regurgitated same.  X-ray reveals no acute radiopaque foreign body in the esophagus.  GI will be consulted for probable food bolus retained in the esophagus.  GI advises they will present to the emergency department for evaluation of the patient for likely endoscopic procedure to remove food bolus.  Patient care transferred to GI service for EGD for distal esophageal foreign body.   ____________________________________________  FINAL CLINICAL IMPRESSION(S) / ED DIAGNOSES  Final diagnoses:  Distal esophageal obstruction due to foreign body      NEW MEDICATIONS STARTED DURING THIS VISIT:  ED Discharge Orders         Ordered    omeprazole (PRILOSEC) 40 MG capsule  Daily before breakfast     12/22/18 1817    Increase activity slowly     12/22/18 1818    Diet - low sodium heart healthy     12/22/18 1818    Discharge instructions    Comments:   Arlyss Repress, MD [address]  [address]  [place], [place] [address]  Main: [phone]  Fax: [phone] Pager: [phone]   YOU HAD AN ENDOSCOPIC PROCEDURE TODAY: Refer to the procedure report that was given to you for any specific questions about what was found during the examination.  If the procedure report does not answer your questions, please call your gastroenterologist to clarify.  YOU SHOULD EXPECT: Some feelings of bloating in the abdomen. Passage of more gas than usual.  Walking can help get rid of the air that was put into your GI tract during the procedure and reduce the bloating. If you had a lower endoscopy (such as a colonoscopy or flexible  sigmoidoscopy) you may notice spotting of blood in your stool or on the toilet paper.   DIET: Your first meal following the procedure should be a light meal and then it is ok to progress to your normal diet.  A half-sandwich or bowl of soup is an example of a good first meal.  Heavy or fried foods are harder to digest and may make you feel nasueas or bloated.  Drink plenty of fluids but you should avoid alcoholic beverages for 24 hours.  ACTIVITY: Your care partner should take you home directly after the procedure.  You should plan to take it easy, moving slowly for the rest of the day.  You can resume normal activity the day after the procedure however you should NOT DRIVE, make legal decisions or  use heavy machinery for 24 hours (because of the sedation medicines used during the test).    SYMPTOMS TO REPORT IMMEDIATELY  A gastroenterologist can be reached at any hour.  Please call your doctor's office for any of the following symptoms:  Following lower endoscopy (colonoscopy, flexible sigmoidoscopy)  Excessive amounts of blood in the stool  Significant tenderness, worsening of abdominal pains  Swelling of the abdomen that is new, acute  Fever of 100 or higher Following upper endoscopy (EGD, EUS, ERCP)  Vomiting of blood or coffee ground material  New, significant abdominal pain  New, significant chest pain or pain under the shoulder blades  Painful or persistently difficult swallowing  New shortness of breath  Black, tarry-looking stools  FOLLOW UP: If any biopsies were taken you will be contacted by phone or by letter within the next 1-3 weeks.  Call your gastroenterologist if you have not heard about the biopsies in 3 weeks.   Please also call your gastroenterologist's office with any specific questions about appointments or follow up tests.   12/22/18 1818              This chart was dictated using voice recognition software/Dragon. Despite best efforts to proofread, errors  can occur which can change the meaning. Any change was purely unintentional.    Racheal PatchesCuthriell, Jonathan D, PA-C 12/22/18 1900    Don PerkingVeronese, [place], MD 12/22/18 [phone]

## 2018-12-22 NOTE — Anesthesia Procedure Notes (Signed)
Procedure Name: Intubation Date/Time: 12/22/2018 5:58 PM Performed by: Rosaria Ferries, MD Pre-anesthesia Checklist: Patient identified, Patient being monitored, Timeout performed, Emergency Drugs available and Suction available Patient Re-evaluated:Patient Re-evaluated prior to induction Oxygen Delivery Method: Circle system utilized Preoxygenation: Pre-oxygenation with 100% oxygen Induction Type: IV induction Laryngoscope Size: McGraph and 4 Grade View: Grade I Tube type: Oral Tube size: 7.0 mm Number of attempts: 1 Airway Equipment and Method: Stylet Placement Confirmation: ETT inserted through vocal cords under direct vision,  positive ETCO2 and breath sounds checked- equal and bilateral Secured at: 23 cm Tube secured with: Tape Dental Injury: Teeth and Oropharynx as per pre-operative assessment

## 2018-12-24 ENCOUNTER — Encounter: Payer: Self-pay | Admitting: Gastroenterology

## 2018-12-25 ENCOUNTER — Encounter: Payer: Self-pay | Admitting: Gastroenterology

## 2018-12-25 LAB — SURGICAL PATHOLOGY

## 2019-03-25 ENCOUNTER — Other Ambulatory Visit: Payer: Self-pay | Admitting: Gastroenterology

## 2020-01-26 IMAGING — CR NECK SOFT TISSUES - 1+ VIEW
2 series · 2 of 2 positions shown · non-contrast
Comparison: None.

CLINICAL DATA: Steak stuck in throat.

EXAM:
NECK SOFT TISSUES - 1+ VIEW

[neck lat]
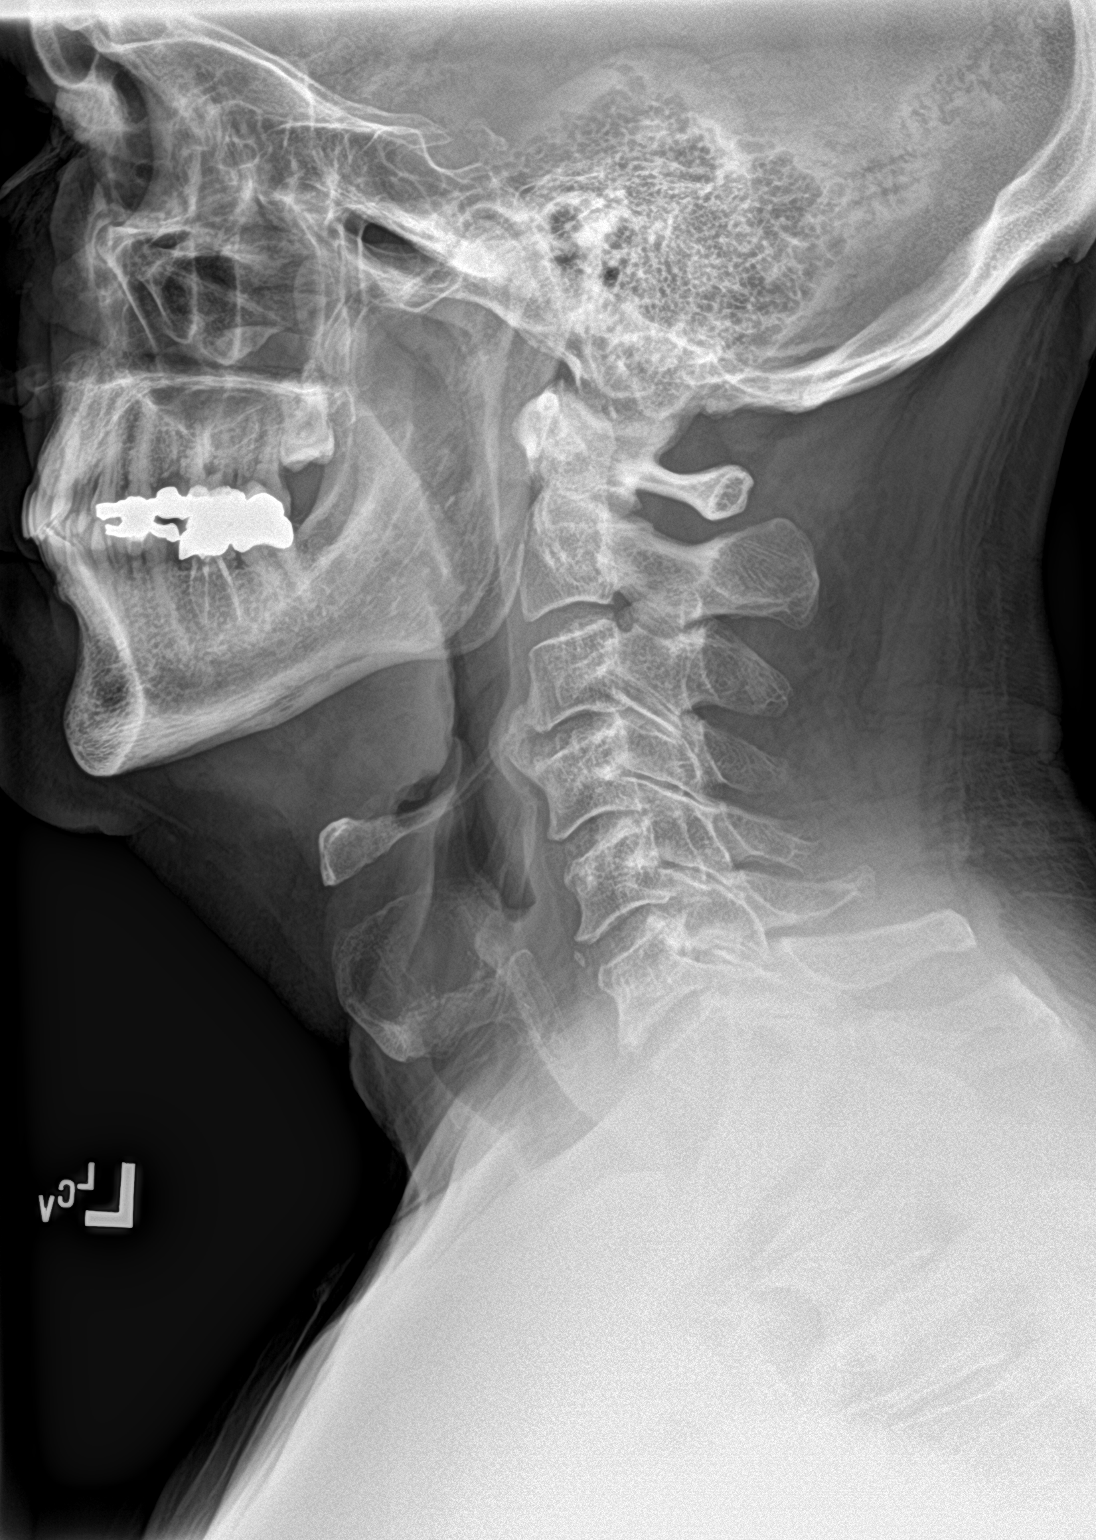

[neck ap]
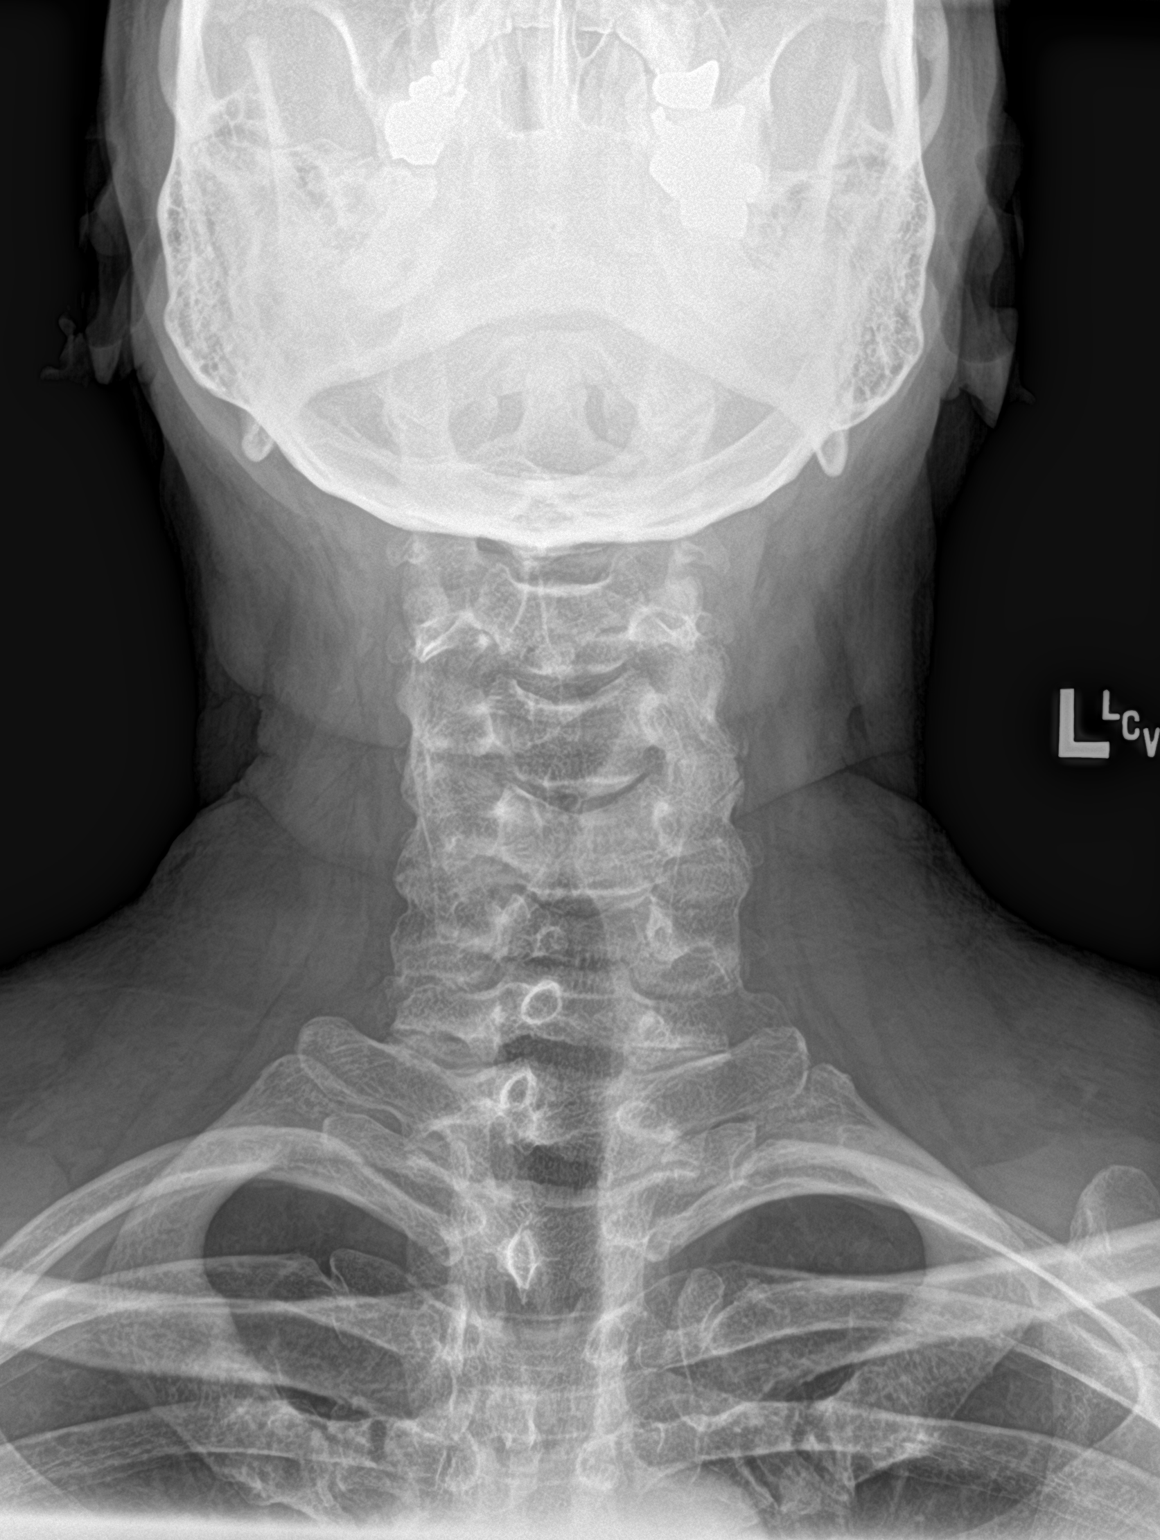

[2 of 2 positions shown; findings below may reference images not displayed]

FINDINGS: Two-view exam shows no prevertebral soft tissue swelling. No gas
evident within the prevertebral soft tissues. Epiglottis and
aryepiglottic folds are unremarkable. Prominent anterior osteophytes
around the C3-4 disc space anteriorly generate mass-effect on the
posterior hypopharynx.
IMPRESSION: Degenerative spurring in the upper cervical spine generates
mass-effect on the posterior hypopharynx. Otherwise unremarkable.

## 2022-10-19 ENCOUNTER — Emergency Department (HOSPITAL_COMMUNITY)
Admission: EM | Admit: 2022-10-19 | Discharge: 2022-10-19 | Disposition: A | Discharge status: Home / Self Care | Payer: Self-pay | Attending: Emergency Medicine | Admitting: Emergency Medicine

## 2022-10-19 ENCOUNTER — Emergency Department (HOSPITAL_COMMUNITY): Payer: Self-pay

## 2022-10-19 ENCOUNTER — Other Ambulatory Visit: Payer: Self-pay

## 2022-10-19 DIAGNOSIS — N2889 Other specified disorders of kidney and ureter: Secondary | ICD-10-CM | POA: Insufficient documentation

## 2022-10-19 DIAGNOSIS — M62830 Muscle spasm of back: Secondary | ICD-10-CM | POA: Insufficient documentation

## 2022-10-19 DIAGNOSIS — R0789 Other chest pain: Secondary | ICD-10-CM | POA: Insufficient documentation

## 2022-10-19 LAB — COMPREHENSIVE METABOLIC PANEL
ALT: 28 U/L (ref 0–44)
AST: 36 U/L (ref 15–41)
Albumin: 3.6 g/dL (ref 3.5–5.0)
Alkaline Phosphatase: 72 U/L (ref 38–126)
Anion gap: 12 (ref 5–15)
BUN: 7 mg/dL (ref 6–20)
CO2: 19 mmol/L — ABNORMAL LOW (ref 22–32)
Calcium: 8.9 mg/dL (ref 8.9–10.3)
Chloride: 102 mmol/L (ref 98–111)
Creatinine, Ser: 0.9 mg/dL (ref 0.61–1.24)
GFR, Estimated: >60 mL/min (ref >60)
Glucose, Bld: 130 mg/dL — ABNORMAL HIGH (ref 70–99)
Potassium: 3.6 mmol/L (ref 3.5–5.1)
Sodium: 133 mmol/L — ABNORMAL LOW (ref 135–145)
Total Bilirubin: 1.9 mg/dL — ABNORMAL HIGH (ref 0.3–1.2)
Total Protein: 7.5 g/dL (ref 6.5–8.1)

## 2022-10-19 LAB — CBC
HCT: 45.4 % (ref 39.0–52.0)
Hemoglobin: 15.7 g/dL (ref 13.0–17.0)
MCH: 33 pg (ref 26.0–34.0)
MCHC: 34.6 g/dL (ref 30.0–36.0)
MCV: 95.4 fL (ref 80.0–100.0)
Platelets: 125 10*3/uL — ABNORMAL LOW (ref 150–400)
RBC: 4.76 MIL/uL (ref 4.22–5.81)
RDW: 13.6 % (ref 11.5–15.5)
WBC: 8.7 10*3/uL (ref 4.0–10.5)
nRBC: 0 % (ref 0.0–0.2)

## 2022-10-19 LAB — LIPASE, BLOOD: Lipase: 41 U/L (ref 11–51)

## 2022-10-19 LAB — URINALYSIS, ROUTINE W REFLEX MICROSCOPIC
Bilirubin Urine: NEGATIVE
Glucose, UA: NEGATIVE mg/dL
Hgb urine dipstick: NEGATIVE
Ketones, ur: NEGATIVE mg/dL
Leukocytes,Ua: NEGATIVE
Nitrite: NEGATIVE
Protein, ur: NEGATIVE mg/dL
Specific Gravity, Urine: >1.046 — ABNORMAL HIGH (ref 1.005–1.030)
pH: 5 (ref 5.0–8.0)

## 2022-10-19 MED ORDER — HYDROMORPHONE HCL 1 MG/ML IJ SOLN
1.0000 mg | Freq: Once | INTRAMUSCULAR | Status: AC
  Administered 2022-10-19: 1 mg via INTRAVENOUS
  Filled 2022-10-19: qty 1

## 2022-10-19 MED ORDER — IBUPROFEN 800 MG PO TABS: 800.0000 mg | ORAL_TABLET | Freq: Four times a day (QID) | ORAL | 0 refills | Status: DC | PRN

## 2022-10-19 MED ORDER — ONDANSETRON HCL 4 MG/2ML IJ SOLN
4.0000 mg | Freq: Once | INTRAMUSCULAR | Status: AC
  Administered 2022-10-19: 4 mg via INTRAVENOUS
  Filled 2022-10-19: qty 2

## 2022-10-19 MED ORDER — METHOCARBAMOL 500 MG PO TABS: 500.0000 mg | ORAL_TABLET | Freq: Three times a day (TID) | ORAL | 0 refills | Status: DC | PRN

## 2022-10-19 MED ORDER — KETOROLAC TROMETHAMINE 30 MG/ML IJ SOLN
15.0000 mg | Freq: Once | INTRAMUSCULAR | Status: AC
  Administered 2022-10-19: 15 mg via INTRAVENOUS
  Filled 2022-10-19: qty 1

## 2022-10-19 MED ORDER — IOHEXOL 350 MG/ML SOLN
100.0000 mL | Freq: Once | INTRAVENOUS | Status: AC | PRN
  Administered 2022-10-19: 100 mL via INTRAVENOUS

## 2022-10-19 NOTE — Discharge Instructions (Signed)
We saw a mass on your right kidney when we did the CAT scan today.  It is not causing your pain, it probably has been there for a while.  You need to follow-up with urology to get further test to make sure this is not cancer.

## 2022-10-19 NOTE — ED Triage Notes (Signed)
Pt from home c/obialteral flank, hip and abdo pain x 4 days. Denies N/V/D

## 2022-10-19 NOTE — ED Provider Notes (Signed)
[facility] Provider Note   CSN: [id number] Arrival date & time: 10/19/22  0119     History  Chief Complaint  Patient presents with   Flank Pain    Rodney Russell is a 62 y.o. male.  Patient presents to the department for evaluation of bilateral posterior rib pain radiating around to the front.  Patient reports that the pain has been present for several days, worse tonight.  He reports that he cannot move or roll over in bed because of severe pain.       Home Medications Prior to Admission medications   Medication Sig Start Date End Date Taking? Authorizing Provider  ibuprofen (ADVIL) 800 MG tablet Take 1 tablet (800 mg total) by mouth every 6 (six) hours as needed for moderate pain. 10/19/22  Yes Quantae Martel, Gwenyth Allegra, MD  methocarbamol (ROBAXIN) 500 MG tablet Take 1 tablet (500 mg total) by mouth every 8 (eight) hours as needed for muscle spasms. 10/19/22  Yes Antawn Sison, Gwenyth Allegra, MD  omeprazole (PRILOSEC) 40 MG capsule Take 1 capsule (40 mg total) by mouth daily before breakfast for 30 days. 12/22/18 01/21/19  Lin Landsman, MD      Allergies    Patient has no known allergies.    Review of Systems   Review of Systems  Physical Exam Updated Vital Signs BP (!) 144/99 (BP Location: Left Arm)   Pulse (!) 102   Temp 97.6 F (36.4 C) (Oral)   Resp 16   Ht 5' 5"$  (1.651 m)   Wt 93 kg   SpO2 98%   BMI 34.11 kg/m  Physical Exam Vitals and nursing note reviewed.  Constitutional:      General: He is not in acute distress.    Appearance: He is well-developed.  HENT:     Head: Normocephalic and atraumatic.     Mouth/Throat:     Mouth: Mucous membranes are moist.  Eyes:     General: Vision grossly intact. Gaze aligned appropriately.     Extraocular Movements: Extraocular movements intact.     Conjunctiva/sclera: Conjunctivae normal.  Cardiovascular:     Rate and Rhythm: Normal rate and regular rhythm.      Pulses: Normal pulses.     Heart sounds: Normal heart sounds, S1 normal and S2 normal. No murmur heard.    No friction rub. No gallop.  Pulmonary:     Effort: Pulmonary effort is normal. No respiratory distress.     Breath sounds: Normal breath sounds.  Chest:     Chest wall: Tenderness present.    Abdominal:     Palpations: Abdomen is soft.     Tenderness: There is no abdominal tenderness. There is no guarding or rebound.     Hernia: No hernia is present.  Musculoskeletal:        General: No swelling.     Cervical back: Full passive range of motion without pain, normal range of motion and neck supple. No pain with movement, spinous process tenderness or muscular tenderness. Normal range of motion.     Thoracic back: Spasms present. Decreased range of motion.     Right lower leg: No edema.     Left lower leg: No edema.  Skin:    General: Skin is warm and dry.     Capillary Refill: Capillary refill takes less than 2 seconds.     Findings: No ecchymosis, erythema, lesion or wound.  Neurological:     Mental Status:  He is alert and oriented to person, place, and time.     GCS: GCS eye subscore is 4. GCS verbal subscore is 5. GCS motor subscore is 6.     Cranial Nerves: Cranial nerves 2-12 are intact.     Sensory: Sensation is intact.     Motor: Motor function is intact. No weakness or abnormal muscle tone.     Coordination: Coordination is intact.  Psychiatric:        Mood and Affect: Mood normal.        Speech: Speech normal.        Behavior: Behavior normal.     ED Results / Procedures / Treatments   Labs (all labs ordered are listed, but only abnormal results are displayed) Labs Reviewed  COMPREHENSIVE METABOLIC PANEL - Abnormal; Notable for the following components:      Result Value   Sodium 133 (*)    CO2 19 (*)    Glucose, Bld 130 (*)    Total Bilirubin 1.9 (*)    All other components within normal limits  CBC - Abnormal; Notable for the following components:    Platelets 125 (*)    All other components within normal limits  URINALYSIS, ROUTINE W REFLEX MICROSCOPIC - Abnormal; Notable for the following components:   Specific Gravity, Urine >1.046 (*)    All other components within normal limits  LIPASE, BLOOD    EKG EKG Interpretation  Date/Time:  Wednesday October 19 2022 01:23:22 EST Ventricular Rate:  103 PR Interval:  176 QRS Duration: 84 QT Interval:  342 QTC Calculation: 448 R Axis:   -60 Text Interpretation: Sinus or ectopic atrial tachycardia Probable left atrial enlargement Inferior infarct, age indeterminate Probable anterolateral infarct, old Confirmed by Orpah Greek [phone]) on 10/19/2022 2:22:06 AM  Radiology CT ANGIO CHEST/ABD/PEL FOR DISSECTION W &/OR WO CONTRAST  Result Date: 10/19/2022 CLINICAL DATA:  Acute aortic syndrome (AAS) . Bilateral flank and abdominal pain EXAM: CT ANGIOGRAPHY CHEST, ABDOMEN AND PELVIS TECHNIQUE: Non-contrast CT of the chest was initially obtained. Multidetector CT imaging through the chest, abdomen and pelvis was performed using the standard protocol during bolus administration of intravenous contrast. Multiplanar reconstructed images and MIPs were obtained and reviewed to evaluate the vascular anatomy. RADIATION DOSE REDUCTION: This exam was performed according to the departmental dose-optimization program which includes automated exposure control, adjustment of the mA and/or kV according to patient size and/or use of iterative reconstruction technique. CONTRAST:  1101m OMNIPAQUE IOHEXOL 350 MG/ML SOLN COMPARISON:  None Available. FINDINGS: CTA CHEST FINDINGS Cardiovascular: The thoracic aorta is normal in course and caliber. No intramural hematoma, dissection, or aneurysm. Mild atherosclerotic calcification. Arch vasculature demonstrates classic anatomic configuration and is widely patent proximally. Mild coronary artery calcification. Global cardiac size within normal limits. No pericardial  effusion. Central pulmonary arteries are of normal caliber. Mediastinum/Nodes: 12 mm nodule within the a left thyroid lobe. Not clinically significant; no follow-up imaging recommended (ref: J Am Coll Radiol. 2015 Feb;12(2): 143-50).No pathologic thoracic adenopathy. Esophagus unremarkable. Large hiatal hernia. Lungs/Pleura: Bibasilar atelectasis. Lungs are otherwise clear. No pneumothorax or pleural effusion. Musculoskeletal: No chest wall abnormality. No acute or significant osseous findings. Review of the MIP images confirms the above findings. CTA ABDOMEN AND PELVIS FINDINGS VASCULAR Aorta: Normal caliber aorta without aneurysm, dissection, vasculitis or significant stenosis. Mild atherosclerotic calcification. Celiac: Patent without evidence of aneurysm, dissection, vasculitis or significant stenosis. SMA: Patent without evidence of aneurysm, dissection, vasculitis or significant stenosis. Renals: Both renal arteries are patent  without evidence of aneurysm, dissection, vasculitis, fibromuscular dysplasia or significant stenosis. IMA: Patent without evidence of aneurysm, dissection, vasculitis or significant stenosis. Inflow: Patent without evidence of aneurysm, dissection, vasculitis or significant stenosis. Veins: No obvious venous abnormality within the limitations of this arterial phase study. Review of the MIP images confirms the above findings. NON-VASCULAR Hepatobiliary: No focal liver abnormality is seen. No gallstones, gallbladder wall thickening, or biliary dilatation. Pancreas: Unremarkable Spleen: Unremarkable Adrenals/Urinary Tract: The adrenal glands are unremarkable. The kidneys are normal in size and position. A solid heterogeneously enhancing mass is seen arising exophytically from the anterior interpolar region of the right kidney measuring 2.8 x 3.1 cm in keeping with a primary renal neoplasm. The kidneys are otherwise unremarkable. Bladder unremarkable. Stomach/Bowel: Stomach is within normal  limits. Appendix appears normal. No evidence of bowel wall thickening, distention, or inflammatory changes. Lymphatic: No pathologic adenopathy within the abdomen and pelvis. Reproductive: Prostate is unremarkable. Other: Tiny fat containing left inguinal hernia Musculoskeletal: No acute bone abnormality. No lytic or blastic bone lesion. Degenerative changes are seen at the lumbosacral junction. Review of the MIP images confirms the above findings. IMPRESSION: 1. No evidence of thoracoabdominal aortic aneurysm or dissection. 2. Mild coronary artery calcification. 3. Large hiatal hernia. 4. 3.1 cm solid heterogeneously enhancing mass arising exophytically from the anterior interpolar region of the right kidney in keeping with a primary renal neoplasm. No evidence of metastatic disease within the chest, abdomen, and pelvis. Urologic consultation is recommended. Aortic Atherosclerosis (ICD10-I70.0). Electronically Signed   By: Fidela Salisbury M.D.   On: 10/19/2022 03:07   DG Chest Port 1 View  Result Date: 10/19/2022 CLINICAL DATA:  Bilateral rib pain. EXAM: PORTABLE CHEST 1 VIEW COMPARISON:  None Available. FINDINGS: The heart size and mediastinal contours are within normal limits. Lung volumes are low. Airspace disease is noted in the mid to lower left lung field and at the right lung base. No effusion or pneumothorax. No acute osseous abnormality. IMPRESSION: Airspace disease in the mid to lower left lung field and at the right lung base, possible atelectasis or infiltrate. Electronically Signed   By: Brett Fairy M.D.   On: 10/19/2022 01:47    Procedures Procedures    Medications Ordered in ED Medications  HYDROmorphone (DILAUDID) injection 1 mg (1 mg Intravenous Given 10/19/22 0135)  ondansetron (ZOFRAN) injection 4 mg (4 mg Intravenous Given 10/19/22 0135)  iohexol (OMNIPAQUE) 350 MG/ML injection 100 mL (100 mLs Intravenous Contrast Given 10/19/22 0252)  HYDROmorphone (DILAUDID) injection 1 mg (1 mg  Intravenous Given 10/19/22 0405)  ketorolac (TORADOL) 30 MG/ML injection 15 mg (15 mg Intravenous Given 10/19/22 0515)    ED Course/ Medical Decision Making/ A&P                             Medical Decision Making Amount and/or Complexity of Data Reviewed External Data Reviewed: labs, radiology, ECG and notes. Labs: ordered. Decision-making details documented in ED Course. Radiology: ordered and independent interpretation performed. Decision-making details documented in ED Course. ECG/medicine tests: ordered and independent interpretation performed. Decision-making details documented in ED Course.  Risk Prescription drug management.   Patient presents to the emergency department for evaluation of pain in the thoracic area radiating around the ribs bilaterally.  He reports he has had some pain for several days but pain significantly worsened tonight.  He was unable to roll over or get out of bed because of severe pain.  Multiple diagnoses  were considered including musculoskeletal thoracic pain, pleuritic pain from lung infection and/or PE, aortic syndrome, gallbladder disease, pancreatitis.  Patient's pain is very reproducible.  Patient has tenderness to palpation and severe pain with movement.  EKG does not show ischemia, doubt cardiac etiology.  Lab work was unrevealing.  Patient treated with analgesia and he has had some improvement in his pain level.  CT chest abdomen and pelvis to rule out aortic dissection was performed.  Aorta is normal but he does have evidence of a renal mass which is an incidental finding.  This was discussed with him, refer to urology for further workup.  Patient will be appropriate for discharge and treatment for musculoskeletal pain.        Final Clinical Impression(s) / ED Diagnoses Final diagnoses:  Renal mass, right  Spasm of thoracic back muscle    Rx / DC Orders ED Discharge Orders          Ordered    methocarbamol (ROBAXIN) 500 MG tablet   Every 8 hours PRN        10/19/22 0513    ibuprofen (ADVIL) 800 MG tablet  Every 6 hours PRN        10/19/22 0513              Orpah Greek, MD 10/19/22 [phone]

## 2022-10-19 NOTE — ED Notes (Signed)
Pt attempted to provide urine sample, without success

## 2022-10-19 NOTE — ED Notes (Signed)
Pt to CT

## 2023-09-20 NOTE — Consults (Signed)
-------------------------------------------------------------------------------   Attestation signed by Darrick Peck, MD at 09/20/23 1143 I saw and evaluated the patient, participating in the key portions of the service. I reviewed the fellow's note. I agree with the fellow's findings and plan.  Patient presenting with food impaction (has had 3 prior impactions and prior EGD with dilation in the past).  Proceed with urgent EGD today.   Issues Impacting Complexity of Management: -No additional from what is noted in the resident/fellow/APP documentation  Peck Darrick, MD  -------------------------------------------------------------------------------      Luminal Gastroenterology Consult Service  Initial Consultation     Assessment and Recommendations:  Rodney Russell is a 63 y.o. male with a PMHx of gerd who presented to [facility] with food impaction. The patient is seen in consultation at the request of May Caro Colt, MD (Emergency Medicine) for food impaction.  Food impaction H/o esophageal strictures requiring dilations (x3) P/w sensation of food stuck in esophagus since 6pm 1/14. Has history of food impactions requiring EGD and esophageal dilation, unclear etiology. Can consider esophageal webs, strictures, EOE, esophagitis.  - maintain NPO - proceed with EGD to eval for food impaction and consider dilation as needed - continue PPI - needs outpatient GI follow up either w/ home GI or can follow with [facility]  GI Pre-Procedure Checklist Procedure: Upper Endoscopy Anticipated Date of Procedure: 1/15 Anticoagulants/Antiplatelets: Not Applicable Anesthesia Concerns: None Diet: Keep patient NPO  Issues Impacting Complexity of Management: -None  Recommendations discussed with the patient's primary team. We will continue to follow along with you.  History of Present Illness:   89M p/w c/f food impaction. Ate country fried steak at 6pm 1/14, felt food sticking, has not passed,  presented to [facility] this AM, xfered to [facility] for endoscopic mgmt.  Says he has had EGD for food impaction at least 3 times over last 10-12 years, 3x also w/ esophageal dilation (last EGD 2-3 years ago). Was told more recently that when he was put to sleep the food bolus moved forward and did not require EGD removal.  Says symptoms of food sticking happen often usually with first bite of meat, he takes it slow and it passes and then he can continue meal. Felt food stuck super bowl last year, went to sleep and bolus passed did not go to ED.  Denies being told etiology of esophageal disease. Only PMHx of GERD on PPI. Drinks vodka mixed drink every other day, and beer on weekend. No tobacco, drug use.  -I have reviewed the patient's prior records from this admission as summarized in the HPI  Objective:  Temp:  [36.6 C (97.9 F)] 36.6 C (97.9 F) Heart Rate:  [83] 83 SpO2 Pulse:  [99] 99 Resp:  [16] 16 BP: (184)/(119) 184/119 SpO2:  [99 %] 99 %  Gen: WDWN male in NAD, answers questions appropriately Abdomen: Soft, NTND, no rebound/guarding, no hepatosplenomegaly Extremities: No edema in the BLEs  Pertinent Labs/Studies: -I have reviewed the patient's labs from 09/20/23 which show stable Hgb and thrombocytopenia

## 2023-09-27 NOTE — ED Notes (Signed)
 09/27/23 Surgical path report received by ED resource nurse. Per Dr. Ross, no action indicated by resource RN team.

## 2024-04-04 ENCOUNTER — Other Ambulatory Visit: Payer: Self-pay | Admitting: Physician Assistant

## 2024-04-04 DIAGNOSIS — R748 Abnormal levels of other serum enzymes: Secondary | ICD-10-CM

## 2024-04-08 ENCOUNTER — Ambulatory Visit
Admission: RE | Admit: 2024-04-08 | Discharge: 2024-04-08 | Disposition: A | Discharge status: Home / Self Care | Payer: Self-pay | Source: Ambulatory Visit | Attending: Physician Assistant | Admitting: Physician Assistant

## 2024-04-08 DIAGNOSIS — R748 Abnormal levels of other serum enzymes: Secondary | ICD-10-CM | POA: Insufficient documentation

## 2024-04-09 ENCOUNTER — Other Ambulatory Visit: Payer: Self-pay | Admitting: Physician Assistant

## 2024-04-09 DIAGNOSIS — N2889 Other specified disorders of kidney and ureter: Secondary | ICD-10-CM

## 2024-04-09 DIAGNOSIS — R748 Abnormal levels of other serum enzymes: Secondary | ICD-10-CM

## 2024-04-30 ENCOUNTER — Encounter (HOSPITAL_COMMUNITY): Payer: Self-pay

## 2024-04-30 ENCOUNTER — Emergency Department (HOSPITAL_COMMUNITY): Payer: Self-pay

## 2024-04-30 ENCOUNTER — Emergency Department (HOSPITAL_COMMUNITY)
Admission: EM | Admit: 2024-04-30 | Discharge: 2024-04-30 | Disposition: A | Discharge status: Home / Self Care | Payer: Self-pay | Attending: Emergency Medicine | Admitting: Emergency Medicine

## 2024-04-30 ENCOUNTER — Other Ambulatory Visit: Payer: Self-pay

## 2024-04-30 DIAGNOSIS — R600 Localized edema: Secondary | ICD-10-CM | POA: Insufficient documentation

## 2024-04-30 DIAGNOSIS — D649 Anemia, unspecified: Secondary | ICD-10-CM | POA: Insufficient documentation

## 2024-04-30 DIAGNOSIS — K7031 Alcoholic cirrhosis of liver with ascites: Secondary | ICD-10-CM | POA: Insufficient documentation

## 2024-04-30 DIAGNOSIS — E876 Hypokalemia: Secondary | ICD-10-CM | POA: Insufficient documentation

## 2024-04-30 LAB — BASIC METABOLIC PANEL WITH GFR
Anion gap: 14 (ref 5–15)
BUN: 5 mg/dL — ABNORMAL LOW (ref 8–23)
CO2: 23 mmol/L (ref 22–32)
Calcium: 7.9 mg/dL — ABNORMAL LOW (ref 8.9–10.3)
Chloride: 95 mmol/L — ABNORMAL LOW (ref 98–111)
Creatinine, Ser: 1.02 mg/dL (ref 0.61–1.24)
GFR, Estimated: >60 mL/min (ref >60)
Glucose, Bld: 194 mg/dL — ABNORMAL HIGH (ref 70–99)
Potassium: 2.9 mmol/L — ABNORMAL LOW (ref 3.5–5.1)
Sodium: 132 mmol/L — ABNORMAL LOW (ref 135–145)

## 2024-04-30 LAB — CBC
HCT: 38.3 % — ABNORMAL LOW (ref 39.0–52.0)
Hemoglobin: 12.8 g/dL — ABNORMAL LOW (ref 13.0–17.0)
MCH: 29.8 pg (ref 26.0–34.0)
MCHC: 33.4 g/dL (ref 30.0–36.0)
MCV: 89.1 fL (ref 80.0–100.0)
Platelets: 167 K/uL (ref 150–400)
RBC: 4.3 MIL/uL (ref 4.22–5.81)
RDW: 17.2 % — ABNORMAL HIGH (ref 11.5–15.5)
WBC: 7.9 K/uL (ref 4.0–10.5)
nRBC: 0 % (ref 0.0–0.2)

## 2024-04-30 LAB — HEPATIC FUNCTION PANEL
ALT: 21 U/L (ref 0–44)
AST: 53 U/L — ABNORMAL HIGH (ref 15–41)
Albumin: 2.4 g/dL — ABNORMAL LOW (ref 3.5–5.0)
Alkaline Phosphatase: 93 U/L (ref 38–126)
Bilirubin, Direct: 1 mg/dL — ABNORMAL HIGH (ref 0.0–0.2)
Indirect Bilirubin: 1.6 mg/dL — ABNORMAL HIGH (ref 0.3–0.9)
Total Bilirubin: 2.6 mg/dL — ABNORMAL HIGH (ref 0.0–1.2)
Total Protein: 6.7 g/dL (ref 6.5–8.1)

## 2024-04-30 LAB — MAGNESIUM: Magnesium: 1.7 mg/dL (ref 1.7–2.4)

## 2024-04-30 LAB — BRAIN NATRIURETIC PEPTIDE: B Natriuretic Peptide: 41.2 pg/mL (ref 0.0–100.0)

## 2024-04-30 MED ORDER — POTASSIUM CHLORIDE CRYS ER 20 MEQ PO TBCR
40.0000 meq | EXTENDED_RELEASE_TABLET | Freq: Once | ORAL | Status: AC
  Administered 2024-04-30: 40 meq via ORAL
  Filled 2024-04-30: qty 2

## 2024-04-30 MED ORDER — POTASSIUM CHLORIDE 10 MEQ/100ML IV SOLN
10.0000 meq | Freq: Once | INTRAVENOUS | Status: AC
  Administered 2024-04-30: 10 meq via INTRAVENOUS
  Filled 2024-04-30: qty 100

## 2024-04-30 MED ORDER — MAGNESIUM OXIDE -MG SUPPLEMENT 400 (240 MG) MG PO TABS
400.0000 mg | ORAL_TABLET | Freq: Once | ORAL | Status: AC
  Administered 2024-04-30: 400 mg via ORAL
  Filled 2024-04-30: qty 1

## 2024-04-30 NOTE — ED Notes (Signed)
 Secondary RN called to check status of previous add-on labs. Lab states they are busy at this time. EDP made aware of reason for delay.

## 2024-04-30 NOTE — Discharge Instructions (Addendum)
 Please follow-up with a gastroenterologist regarding your cirrhosis. Continue to take your lasix. You had no evidence of heart failure exacerbation on exam and labs, however you could also benefit from an outpatient ultrasound of your heart. Call your PCP to coordinate.  Return to the emergency department if you develop severe abdominal pain, fever and chills or shortness of breath, chest pain or significantly worsening abdominal distention and swelling.  Your potassium was low today and was replenished orally and IV.

## 2024-04-30 NOTE — ED Provider Notes (Signed)
 [place] [facility] AT [facility] Provider Note   CSN: [id number] Arrival date & time: 04/30/24  1106     Patient presents with: Shortness of Breath and Leg Swelling   Rodney Russell is a 63 y.o. male.  {Add pertinent medical, surgical, social history, OB history to HPI:32947}  Shortness of Breath Associated symptoms: no abdominal pain and no chest pain      63 year old male with medical history significant for GERD, recent diagnosis of renal mass, recent outpatient ultrasound of the abdomen which showed evidence of liver cirrhosis who presents to the emergency department with peripheral edema and ascites.  The patient states that he has been on Lasix and has been compliant with the medication.  He denies any chest pain or shortness of breath.  He denies any abdominal pain, fevers or chills.  He has had worsening lower extremity swelling despite his Lasix which has been bothersome for him.  He states that he has had an outpatient echocardiogram at some point however I do not see any in the medical record.  No known history of heart failure.  He states that he was not aware of the results of his ultrasound of the abdomen outpatient as of yet.  He saw gastroenterology in the past for EGD outpatient and had been seen by Dr. Samuella of the [place] [facility].  Prior to Admission medications   Medication Sig Start Date End Date Taking? Authorizing Provider  furosemide (LASIX) 40 MG tablet Take 40 mg by mouth 2 (two) times daily. 04/03/24  Yes [provider]  meloxicam (MOBIC) 15 MG tablet Take 15 mg by mouth daily as needed for pain. 04/03/24  Yes [provider]  pantoprazole (PROTONIX) 40 MG tablet Take 40 mg by mouth every other day. 11/14/23  Yes [provider]    Allergies: Patient has no known allergies.    Review of Systems  Respiratory:  Negative for shortness of breath.   Cardiovascular:  Positive for leg swelling. Negative for  chest pain.  Gastrointestinal:  Positive for abdominal distention. Negative for abdominal pain.  All other systems reviewed and are negative.   Updated Vital Signs BP (!) 151/91   Pulse 85   Temp 97.9 F (36.6 C) (Oral)   Resp 15   Ht 5' 8 (1.727 m)   Wt 99.8 kg   SpO2 100%   BMI 33.45 kg/m   Physical Exam Vitals and nursing note reviewed.  Constitutional:      General: He is not in acute distress.    Appearance: He is well-developed.  HENT:     Head: Normocephalic and atraumatic.  Eyes:     Conjunctiva/sclera: Conjunctivae normal.  Cardiovascular:     Rate and Rhythm: Normal rate and regular rhythm.     Heart sounds: No murmur heard. Pulmonary:     Effort: Pulmonary effort is normal. No respiratory distress.     Breath sounds: Normal breath sounds.  Abdominal:     Palpations: Abdomen is soft. There is fluid wave and hepatomegaly.     Tenderness: There is no abdominal tenderness.     Comments: Abdominal distention and fluid wave present, nontender abdominal exam.  Musculoskeletal:     Cervical back: Neck supple.     Right lower leg: Edema present.     Left lower leg: Edema present.     Comments: 2+ bilateral lower extremity edema  Skin:    General: Skin is warm and dry.  Capillary Refill: Capillary refill takes less than 2 seconds.  Neurological:     Mental Status: He is alert.  Psychiatric:        Mood and Affect: Mood normal.     (all labs ordered are listed, but only abnormal results are displayed) Labs Reviewed  BASIC METABOLIC PANEL WITH GFR - Abnormal; Notable for the following components:      Result Value   Sodium 132 (*)    Potassium 2.9 (*)    Chloride 95 (*)    Glucose, Bld 194 (*)    BUN 5 (*)    Calcium 7.9 (*)    All other components within normal limits  CBC - Abnormal; Notable for the following components:   Hemoglobin 12.8 (*)    HCT 38.3 (*)    RDW 17.2 (*)    All other components within normal limits  BRAIN NATRIURETIC PEPTIDE   MAGNESIUM   HEPATIC FUNCTION PANEL    EKG: EKG Interpretation Date/Time:  Tuesday April 30 2024 11:38:03 EDT Ventricular Rate:  91 PR Interval:  186 QRS Duration:  92 QT Interval:  398 QTC Calculation: 489 R Axis:   189  Text Interpretation: Normal sinus rhythm Right superior axis deviation Possible Inferior infarct , age undetermined Cannot rule out Anterior infarct , age undetermined Abnormal ECG When compared with ECG of 19-Oct-2022 01:23, PREVIOUS ECG IS PRESENT Confirmed by Jerrol Agent ([id number]) on 04/30/2024 3:20:24 PM  Radiology: DG Chest 2 View Result Date: 04/30/2024 EXAM: 2 VIEW(S) XRAY OF THE CHEST 04/30/2024 12:27:00 PM COMPARISON: 10/19/2022 CLINICAL HISTORY: SHOB. Reason for exam: SHOB; Triage notes: Patient c/o shob and leg swelling x a month. No hx of CHF reported but endorses fluid on his lower abdomen as well. Does not appear in distress on ambulation to registration. Pt states he has kidney mass to be removed. FINDINGS: LUNGS AND PLEURA: Low lung volumes. No focal pulmonary opacity. No pulmonary edema. No pleural effusion. No pneumothorax. HEART AND MEDIASTINUM: Hiatal hernia. No other  abnormality of the cardiac and mediastinal silhouettes. BONES AND SOFT TISSUES: No acute osseous abnormality. IMPRESSION: 1. No acute findings. 2. Low lung volumes and hiatal hernia. Electronically signed by: Katheleen Faes MD 04/30/2024 12:57 PM EDT RP Workstation: [id number]    {Document cardiac monitor, telemetry assessment procedure when appropriate:32947} Procedures   Medications Ordered in the ED  potassium chloride  10 mEq in 100 mL IVPB (has no administration in time range)  potassium chloride  SA (KLOR-CON  M) CR tablet 40 mEq (has no administration in time range)      {Click here for ABCD2, HEART and other calculators REFRESH Note before signing:1}                              Medical Decision Making Amount and/or Complexity of Data Reviewed Labs: ordered. Radiology:  ordered.  Risk OTC drugs.    63 year old male with medical history significant for GERD, recent diagnosis of renal mass, recent outpatient ultrasound of the abdomen which showed evidence of liver cirrhosis who presents to the emergency department with peripheral edema and ascites.  The patient states that he has been on Lasix and has been compliant with the medication.  He denies any chest pain or shortness of breath.  He denies any abdominal pain, fevers or chills.  He has had worsening lower extremity swelling despite his Lasix which has been bothersome for him.  He states that he has had an outpatient echocardiogram  at some point however I do not see any in the medical record.  No known history of heart failure.  He states that he was not aware of the results of his ultrasound of the abdomen outpatient as of yet.  He saw gastroenterology in the past for EGD outpatient and had been seen by Dr. Samuella of the [place] [facility].  On arrival, the patient was afebrile, not tachycardic or tachypneic, saturating 100% on room air, BP 149/95.  Patient on exam had ascites present, nontender abdomen.  Low concern for SBP.  Reviewed the patient's recent outpatient ultrasound: IMPRESSION: Hepatic cirrhosis. No hepatic mass identified.   Mild splenomegaly, consistent with portal venous hypertension.   Moderate ascites.   Diffuse gallbladder wall thickening, likely secondary to hepatocellular disease. No evidence of cholelithiasis or biliary ductal dilatation.   3.5 cm subcapsular mass in midpole of right kidney, which measures slightly larger compared to prior CT. This is highly suspicious for renal cell carcinoma. Renal protocol abdomen MRI or CT without and with contrast is recommended for further characterization.  The patient was unaware of the diagnosis of hepatic cirrhosis or other results of his ultrasound.  He did have knowledge of his kidney mass is being evaluated outpatient for removal.  He is  on Lasix and has been compliant with the medication, takes 40 mg twice daily.  {Document critical care time when appropriate  Document review of labs and clinical decision tools ie CHADS2VASC2, etc  Document your independent review of radiology images and any outside records  Document your discussion with family members, caretakers and with consultants  Document social determinants of health affecting pt's care  Document your decision making why or why not admission, treatments were needed:32947:::1}   Final diagnoses:  None    ED Discharge Orders     None

## 2024-04-30 NOTE — ED Triage Notes (Signed)
 Patient c/o shob and leg swelling x a month. No hx of CHF reported but endorses fluid on his lower abdomen as well. Does not appear in distress on ambulation to registration. Pt states he has kidney mass to be removed.

## 2024-04-30 NOTE — ED Triage Notes (Signed)
 Pt has mass on kidney and is supposed to have kidney taken out September 25th. C/O SHOB and bilateral leg swelling. Pt takes fluid pill. C/O CP.

## 2024-07-01 ENCOUNTER — Other Ambulatory Visit: Payer: Self-pay | Admitting: Urology

## 2024-07-01 DIAGNOSIS — N2889 Other specified disorders of kidney and ureter: Secondary | ICD-10-CM

## 2024-07-10 ENCOUNTER — Ambulatory Visit
Admission: RE | Admit: 2024-07-10 | Discharge: 2024-07-10 | Disposition: A | Payer: Self-pay | Source: Ambulatory Visit | Attending: Urology

## 2024-07-10 ENCOUNTER — Ambulatory Visit
Admission: RE | Admit: 2024-07-10 | Discharge: 2024-07-10 | Disposition: A | Discharge status: Home / Self Care | Payer: Self-pay | Source: Ambulatory Visit | Attending: Urology | Admitting: Urology

## 2024-07-10 DIAGNOSIS — N2889 Other specified disorders of kidney and ureter: Secondary | ICD-10-CM

## 2024-07-10 MED ORDER — IOPAMIDOL (ISOVUE-370) INJECTION 76%
80.0000 mL | Freq: Once | INTRAVENOUS | Status: AC | PRN
  Administered 2024-07-10: 80 mL via INTRAVENOUS

## 2024-07-29 ENCOUNTER — Encounter: Payer: Self-pay | Admitting: Hematology

## 2024-07-29 ENCOUNTER — Inpatient Hospital Stay: Payer: Self-pay | Attending: Hematology | Admitting: Hematology

## 2024-07-29 ENCOUNTER — Inpatient Hospital Stay: Payer: Self-pay

## 2024-07-29 VITALS — BP 137/84 | HR 87 | Temp 98.5°F | Resp 19 | Ht 68.0 in | Wt 206.1 lb

## 2024-07-29 DIAGNOSIS — D696 Thrombocytopenia, unspecified: Secondary | ICD-10-CM

## 2024-07-29 DIAGNOSIS — R188 Other ascites: Secondary | ICD-10-CM | POA: Insufficient documentation

## 2024-07-29 DIAGNOSIS — D649 Anemia, unspecified: Secondary | ICD-10-CM | POA: Insufficient documentation

## 2024-07-29 DIAGNOSIS — N2889 Other specified disorders of kidney and ureter: Secondary | ICD-10-CM | POA: Insufficient documentation

## 2024-07-29 DIAGNOSIS — K7460 Unspecified cirrhosis of liver: Secondary | ICD-10-CM | POA: Insufficient documentation

## 2024-07-29 LAB — CBC WITH DIFFERENTIAL (CANCER CENTER ONLY)
Abs Immature Granulocytes: 0.02 K/uL (ref 0.00–0.07)
Basophils Absolute: 0.1 K/uL (ref 0.0–0.1)
Basophils Relative: 2 %
Eosinophils Absolute: 0.2 K/uL (ref 0.0–0.5)
Eosinophils Relative: 2 %
HCT: 35.4 % — ABNORMAL LOW (ref 39.0–52.0)
Hemoglobin: 12 g/dL — ABNORMAL LOW (ref 13.0–17.0)
Immature Granulocytes: 0 %
Lymphocytes Relative: 27 %
Lymphs Abs: 1.9 K/uL (ref 0.7–4.0)
MCH: 27.5 pg (ref 26.0–34.0)
MCHC: 33.9 g/dL (ref 30.0–36.0)
MCV: 81 fL (ref 80.0–100.0)
Monocytes Absolute: 0.9 K/uL (ref 0.1–1.0)
Monocytes Relative: 12 %
Neutro Abs: 4.1 K/uL (ref 1.7–7.7)
Neutrophils Relative %: 57 %
Platelet Count: 145 K/uL — ABNORMAL LOW (ref 150–400)
RBC: 4.37 MIL/uL (ref 4.22–5.81)
RDW: 15.4 % (ref 11.5–15.5)
WBC Count: 7.1 K/uL (ref 4.0–10.5)
nRBC: 0 % (ref 0.0–0.2)

## 2024-07-29 LAB — RETIC PANEL
Immature Retic Fract: 11.4 % (ref 2.3–15.9)
RBC.: 4.48 MIL/uL (ref 4.22–5.81)
Retic Count, Absolute: 63.2 K/uL (ref 19.0–186.0)
Retic Ct Pct: 1.4 % (ref 0.4–3.1)
Reticulocyte Hemoglobin: 28.5 pg (ref >27.9)

## 2024-07-29 LAB — CMP (CANCER CENTER ONLY)
ALT: 23 U/L (ref 0–44)
AST: 43 U/L — ABNORMAL HIGH (ref 15–41)
Albumin: 3.4 g/dL — ABNORMAL LOW (ref 3.5–5.0)
Alkaline Phosphatase: 128 U/L — ABNORMAL HIGH (ref 38–126)
Anion gap: 10 (ref 5–15)
BUN: 11 mg/dL (ref 8–23)
CO2: 22 mmol/L (ref 22–32)
Calcium: 9.1 mg/dL (ref 8.9–10.3)
Chloride: 105 mmol/L (ref 98–111)
Creatinine: 0.94 mg/dL (ref 0.61–1.24)
GFR, Estimated: >60 mL/min (ref >60)
Glucose, Bld: 94 mg/dL (ref 70–99)
Potassium: 3.9 mmol/L (ref 3.5–5.1)
Sodium: 137 mmol/L (ref 135–145)
Total Bilirubin: 1.4 mg/dL — ABNORMAL HIGH (ref 0.0–1.2)
Total Protein: 7.3 g/dL (ref 6.5–8.1)

## 2024-07-29 LAB — PROTIME-INR
INR: 1.3 — ABNORMAL HIGH (ref 0.8–1.2)
Prothrombin Time: 17 s — ABNORMAL HIGH (ref 11.4–15.2)

## 2024-07-29 LAB — IMMATURE PLATELET FRACTION: Immature Platelet Fraction: 1.3 % (ref 1.2–8.6)

## 2024-07-29 LAB — IRON AND IRON BINDING CAPACITY (CC-WL,HP ONLY)
Iron: 26 ug/dL — ABNORMAL LOW (ref 45–182)
Saturation Ratios: 5 % — ABNORMAL LOW (ref 17.9–39.5)
TIBC: 521 ug/dL — ABNORMAL HIGH (ref 250–450)
UIBC: 495 ug/dL

## 2024-07-29 LAB — FOLATE: Folate: 17.4 ng/mL (ref >5.9)

## 2024-07-29 LAB — VITAMIN B12: Vitamin B-12: 1422 pg/mL — ABNORMAL HIGH (ref 180–914)

## 2024-07-29 LAB — FERRITIN: Ferritin: 23 ng/mL — ABNORMAL LOW (ref 24–336)

## 2024-07-29 MED ORDER — FUROSEMIDE 40 MG PO TABS: 40.0000 mg | ORAL_TABLET | Freq: Every day | ORAL | 0 refills | Status: AC

## 2024-07-29 MED ORDER — SPIRONOLACTONE 25 MG PO TABS
25.0000 mg | ORAL_TABLET | Freq: Every day | ORAL | 0 refills | Status: AC
Start: 2024-07-29 — End: ?

## 2024-07-29 NOTE — Progress Notes (Signed)
 [facility]   [phone] [phone]   Clinic New Consult Note   Patient Care Team: [facility] as PCP - General (Internal Medicine) 07/29/2024  CHIEF COMPLAINTS/PURPOSE OF CONSULTATION:  Anemia and thrombocytopenia   REFERRING PHYSICIAN: Selma Donnice SAUNDERS, MD   Discussed the use of AI scribe software for clinical note transcription with the patient, who gave verbal consent to proceed.  History of Present Illness Rodney Russell is a 63 year old male who presents with low blood counts. He was referred by Dr. Selma, his urologist, for low blood counts.  He had back surgery in the past and developed worsening back pain in February 2024.  He was seen in the ED, CT scan was obtained which showed a 3.1 cm right kidney mass.  He was referred to urology and saw Dr. Selma, surgery was offered but he declined.  He has follow-up with Dr. MARLA a few months ago, repeated the CT scan in early November 2025 showed his right kidney mass has increased in size from 3.1 cm to 3.5 cm over the past year and ten months. There is no associated kidney pain or urinary issues. Liver cirrhosis and small abdominal ascites were also identified on the recent CT scan.   he used to drink alcohol moderately, and stopped about 6 months ago.  He noticed bilateral lower extremity edema last year, was given furosemide  by his PCP, but ran out several months ago.  He reports bilateral lower extremity edema again lately, and increased abdominal girth.    He has mild anemia with a hemoglobin level of 12.8 and a slightly low platelet count. He takes over-the-counter supplements for liver health but is uncertain of their effectiveness.  MEDICAL HISTORY:  Past Medical History:  Diagnosis Date   GERD (gastroesophageal reflux disease)    Liver cirrhosis (HCC)     SURGICAL HISTORY: Past Surgical History:  Procedure Laterality Date   BACK SURGERY     CORONARY ANGIOPLASTY      ESOPHAGEAL DILATION     x 2   ESOPHAGOGASTRODUODENOSCOPY N/A 12/22/2018   Procedure: ESOPHAGOGASTRODUODENOSCOPY (EGD);  Surgeon: Unk Corinn Skiff, MD;  Location: [facility] ENDOSCOPY;  Service: Gastroenterology;  Laterality: N/A;   ESOPHAGOGASTRODUODENOSCOPY (EGD) WITH PROPOFOL  N/A 11/12/2017   Procedure: ESOPHAGOGASTRODUODENOSCOPY (EGD) WITH PROPOFOL ;  Surgeon: Rosalie Kitchens, MD;  Location: [facility] ENDOSCOPY;  Service: Endoscopy;  Laterality: N/A;    SOCIAL HISTORY: Social History   Socioeconomic History   Marital status: Divorced    Spouse name: Not on file   Number of children: 2   Years of education: Not on file   Highest education level: Not on file  Occupational History   Not on file  Tobacco Use   Smoking status: Never   Smokeless tobacco: Never  Vaping Use   Vaping status: Never Used  Substance and Sexual Activity   Alcohol use: Yes    Alcohol/week: 5.0 standard drinks of alcohol    Types: 5 Shots of liquor per week    Comment: quit in May 2025, used to drink moderately for 15 years   Drug use: No   Sexual activity: Yes  Other Topics Concern   Not on file  Social History Narrative   Not on file   Social Drivers of Health   Financial Resource Strain: Not on file  Food Insecurity: No Food Insecurity (07/29/2024)   Hunger Vital Sign    Worried About Running Out of Food in the Last Year: Never true  Ran Out of Food in the Last Year: Never true  Transportation Needs: No Transportation Needs (07/29/2024)   PRAPARE - Administrator, Civil Service (Medical): No    Lack of Transportation (Non-Medical): No  Physical Activity: Not on file  Stress: Not on file  Social Connections: Not on file  Intimate Partner Violence: Not At Risk (07/29/2024)   Humiliation, Afraid, Rape, and Kick questionnaire    Fear of Current or Ex-Partner: No    Emotionally Abused: No    Physically Abused: No    Sexually Abused: No    FAMILY HISTORY: History reviewed. No pertinent family  history.  ALLERGIES:  has no known allergies.  MEDICATIONS:  Current Outpatient Medications  Medication Sig Dispense Refill   Multiple Vitamin (MULTIVITAMIN WITH MINERALS) TABS tablet Take 1 tablet by mouth daily.     spironolactone  (ALDACTONE ) 25 MG tablet Take 1 tablet (25 mg total) by mouth daily. 30 tablet 0   furosemide  (LASIX ) 40 MG tablet Take 1 tablet (40 mg total) by mouth daily. 30 tablet 0   meloxicam (MOBIC) 15 MG tablet Take 15 mg by mouth daily as needed for pain. (Patient not taking: Reported on 07/29/2024)     pantoprazole (PROTONIX) 40 MG tablet Take 40 mg by mouth every other day. (Patient not taking: Reported on 07/29/2024)     No current facility-administered medications for this visit.    REVIEW OF SYSTEMS:   Constitutional: Denies fevers, chills or abnormal night sweats, mild fatigue  Eyes: Denies blurriness of vision, double vision or watery eyes Ears, nose, mouth, throat, and face: Denies mucositis or sore throat Respiratory: Denies cough, dyspnea or wheezes Cardiovascular: Denies palpitation, chest discomfort or lower extremity swelling Gastrointestinal:  Denies nausea, heartburn or change in bowel habits Skin: Denies abnormal skin rashes Lymphatics: Denies new lymphadenopathy or easy bruising Neurological:Denies numbness, tingling or new weaknesses Behavioral/Psych: Mood is stable, no new changes  All other systems were reviewed with the patient and are negative.  PHYSICAL EXAMINATION: ECOG PERFORMANCE STATUS: 1 - Symptomatic but completely ambulatory  Vitals:   07/29/24 1423  BP: 137/84  Pulse: 87  Resp: 19  Temp: 98.5 F (36.9 C)  SpO2: 97%   Filed Weights   07/29/24 1423  Weight: 206 lb 1.6 oz (93.5 kg)    GENERAL:alert, no distress and comfortable SKIN: skin color, texture, turgor are normal, no rashes or significant lesions EYES: normal, conjunctiva are pink and non-injected, sclera clear OROPHARYNX:no exudate, no erythema and lips,  buccal mucosa, and tongue normal  NECK: supple, thyroid normal size, non-tender, without nodularity LYMPH:  no palpable lymphadenopathy in the cervical, axillary or inguinal LUNGS: clear to auscultation and percussion with normal breathing effort HEART: regular rate & rhythm and no murmurs, (+)b/l lower extremity edema ABDOMEN:abdomen soft, non-tender and normal bowel sounds, (+) small ascites  Musculoskeletal:no cyanosis of digits and no clubbing  PSYCH: alert & oriented x 3 with fluent speech NEURO: no focal motor/sensory deficits  Physical Exam   LABORATORY DATA:  I have reviewed the data as listed    Latest Ref Rng & Units 07/29/2024    3:00 PM 04/30/2024   11:33 AM 10/19/2022    1:20 AM  CBC  WBC 4.0 - 10.5 K/uL 7.1  7.9  8.7   Hemoglobin 13.0 - 17.0 g/dL 87.9  87.1  84.2   Hematocrit 39.0 - 52.0 % 35.4  38.3  45.4   Platelets 150 - 400 K/uL 145  167  125  Latest Ref Rng & Units 07/29/2024    3:00 PM 04/30/2024    7:33 PM 04/30/2024   11:33 AM  CMP  Glucose 70 - 99 mg/dL 94   805   BUN 8 - 23 mg/dL 11   5   Creatinine 9.38 - 1.24 mg/dL 9.05   8.97   Sodium 864 - 145 mmol/L 137   132   Potassium 3.5 - 5.1 mmol/L 3.9   2.9   Chloride 98 - 111 mmol/L 105   95   CO2 22 - 32 mmol/L 22   23   Calcium 8.9 - 10.3 mg/dL 9.1   7.9   Total Protein 6.5 - 8.1 g/dL 7.3  6.7    Total Bilirubin 0.0 - 1.2 mg/dL 1.4  2.6    Alkaline Phos 38 - 126 U/L 128  93    AST 15 - 41 U/L 43  53    ALT 0 - 44 U/L 23  21       RADIOGRAPHIC STUDIES: I have personally reviewed the radiological images as listed and agreed with the findings in the report. CT ABDOMEN W WO CONTRAST Result Date: 07/15/2024 CLINICAL DATA:  Right renal mass * Tracking Code: BO * EXAM: CT CHEST WITHOUT CONTRAST CT ABDOMEN WITHOUT AND WITH CONTRAST TECHNIQUE: Multidetector CT imaging of the chest was performed following the standard protocol before the bolus administration of intravenous contrast. Multidetector CT  imaging of the abdomen was performed following the standard protocol before and following the bolus administration of intravenous contrast. RADIATION DOSE REDUCTION: This exam was performed according to the departmental dose-optimization program which includes automated exposure control, adjustment of the mA and/or kV according to patient size and/or use of iterative reconstruction technique. CONTRAST:  80mL ISOVUE -370 IOPAMIDOL  (ISOVUE -370) INJECTION 76% COMPARISON:  CT chest abdomen pelvis angiogram, 10/19/2022 FINDINGS: CT CHEST FINDINGS Cardiovascular: Aortic atherosclerosis. Cardiomegaly. Left and right coronary artery calcifications. No pericardial effusion. Mediastinum/Nodes: No enlarged mediastinal, hilar, or axillary lymph nodes. Large hiatal hernia containing the gastric body, fundus, and fluid. Thyroid gland, trachea, and esophagus demonstrate no significant findings. Lungs/Pleura: Compressive atelectasis of the medial lung bases secondary to hiatal hernia sac. Unchanged benign subpleural nodule of the anterior medial segment right middle lobe measuring 0.4 cm (series 5, image 86). No pleural effusion or pneumothorax. Musculoskeletal: No chest wall abnormality. No acute osseous findings. CT ABDOMEN FINDINGS Hepatobiliary: Coarse, nodular cirrhotic morphology of the liver. No suspicious liver lesions or contrast enhancement. No gallstones, gallbladder wall thickening, or biliary dilatation. Pancreas: Unremarkable. No pancreatic ductal dilatation or surrounding inflammatory changes. Spleen: Mild splenomegaly, maximum coronal span 13.7 cm. Adrenals/Urinary Tract: Adrenal glands are unremarkable. Slight interval enlargement of a heterogeneously enhancing mass of the anterior midportion of the right kidney measuring 3.5 x 3.4 cm, previously 3.1 x 2.8 cm (series 3, image 44). Left kidney is normal, without renal calculi, solid lesion, or hydronephrosis. Stomach/Bowel: Stomach is within normal limits. Appendix  appears normal. No evidence of bowel wall thickening, distention, or inflammatory changes. Vascular/Lymphatic: Aortic atherosclerosis. Varices throughout the upper abdomen. No enlarged abdominal lymph nodes. Other: No abdominal wall hernia or abnormality. Moderate volume perihepatic and perisplenic ascites Musculoskeletal: No acute osseous findings. IMPRESSION: 1. Slight interval enlargement of a heterogeneously enhancing mass of the anterior midportion of the right kidney measuring 3.5 x 3.4 cm, previously 3.1 x 2.8 cm. This remains consistent with a slowly enlarging renal cell carcinoma. 2. No evidence of renal vein invasion, lymphadenopathy or metastatic disease in the chest  or abdomen. 3. Cirrhosis and moderate volume ascites. Upper abdominal varices. Mild splenomegaly. 4. Large hiatal hernia containing the gastric body, fundus, and fluid. 5. Cardiomegaly and coronary artery disease. Aortic Atherosclerosis (ICD10-I70.0). Electronically Signed   By: Marolyn JONETTA Jaksch M.D.   On: 07/15/2024 20:51   CT CHEST WO CONTRAST Result Date: 07/15/2024 CLINICAL DATA:  Right renal mass * Tracking Code: BO * EXAM: CT CHEST WITHOUT CONTRAST CT ABDOMEN WITHOUT AND WITH CONTRAST TECHNIQUE: Multidetector CT imaging of the chest was performed following the standard protocol before the bolus administration of intravenous contrast. Multidetector CT imaging of the abdomen was performed following the standard protocol before and following the bolus administration of intravenous contrast. RADIATION DOSE REDUCTION: This exam was performed according to the departmental dose-optimization program which includes automated exposure control, adjustment of the mA and/or kV according to patient size and/or use of iterative reconstruction technique. CONTRAST:  80mL ISOVUE -370 IOPAMIDOL  (ISOVUE -370) INJECTION 76% COMPARISON:  CT chest abdomen pelvis angiogram, 10/19/2022 FINDINGS: CT CHEST FINDINGS Cardiovascular: Aortic atherosclerosis.  Cardiomegaly. Left and right coronary artery calcifications. No pericardial effusion. Mediastinum/Nodes: No enlarged mediastinal, hilar, or axillary lymph nodes. Large hiatal hernia containing the gastric body, fundus, and fluid. Thyroid gland, trachea, and esophagus demonstrate no significant findings. Lungs/Pleura: Compressive atelectasis of the medial lung bases secondary to hiatal hernia sac. Unchanged benign subpleural nodule of the anterior medial segment right middle lobe measuring 0.4 cm (series 5, image 86). No pleural effusion or pneumothorax. Musculoskeletal: No chest wall abnormality. No acute osseous findings. CT ABDOMEN FINDINGS Hepatobiliary: Coarse, nodular cirrhotic morphology of the liver. No suspicious liver lesions or contrast enhancement. No gallstones, gallbladder wall thickening, or biliary dilatation. Pancreas: Unremarkable. No pancreatic ductal dilatation or surrounding inflammatory changes. Spleen: Mild splenomegaly, maximum coronal span 13.7 cm. Adrenals/Urinary Tract: Adrenal glands are unremarkable. Slight interval enlargement of a heterogeneously enhancing mass of the anterior midportion of the right kidney measuring 3.5 x 3.4 cm, previously 3.1 x 2.8 cm (series 3, image 44). Left kidney is normal, without renal calculi, solid lesion, or hydronephrosis. Stomach/Bowel: Stomach is within normal limits. Appendix appears normal. No evidence of bowel wall thickening, distention, or inflammatory changes. Vascular/Lymphatic: Aortic atherosclerosis. Varices throughout the upper abdomen. No enlarged abdominal lymph nodes. Other: No abdominal wall hernia or abnormality. Moderate volume perihepatic and perisplenic ascites Musculoskeletal: No acute osseous findings. IMPRESSION: 1. Slight interval enlargement of a heterogeneously enhancing mass of the anterior midportion of the right kidney measuring 3.5 x 3.4 cm, previously 3.1 x 2.8 cm. This remains consistent with a slowly enlarging renal cell  carcinoma. 2. No evidence of renal vein invasion, lymphadenopathy or metastatic disease in the chest or abdomen. 3. Cirrhosis and moderate volume ascites. Upper abdominal varices. Mild splenomegaly. 4. Large hiatal hernia containing the gastric body, fundus, and fluid. 5. Cardiomegaly and coronary artery disease. Aortic Atherosclerosis (ICD10-I70.0). Electronically Signed   By: Marolyn JONETTA Jaksch M.D.   On: 07/15/2024 20:51    Assessment & Plan Mild anemia and thrombocytopenia Likely secondary to chronic liver cirrhosis. Hemoglobin was 12.8 g/dL in August 2025, slightly below normal. Platelet count was normal but previously low in the range of 120-150. Nutritional deficiencies such as iron, B12, or folate are considered as potential causes. - Ordered blood work to evaluate for nutritional deficiencies (iron, B12, folate). -My suspicion for MDS or other primary bone marrow disease is low, I do not think he needs a bone marrow biopsy. - Monitor blood counts and adjust treatment based on liver disease management.  Liver cirrhosis with ascites and lower extremity edema Chronic liver disease with ascites and lower extremity edema, likely related to alcohol use. Recent CT scan shows ascites and a slightly enlarged right kidney mass. Liver disease is suspected to be contributing to mild anemia and thrombocytopenia. He has abstained from alcohol for six months, which is beneficial for liver function. He is concerned about the prognosis and desires further evaluation and treatment. - Referred to liver specialist for further evaluation and management. - Ordered blood work to assess liver function and rule out hepatitis. - Prescribed spironolactone  and furosemide  for ascites and edema management. - Advised continuation of alcohol abstinence. - Recommended dietary modifications to support liver health.  Right kidney mass, suspicious for malignancy Right kidney mass, initially identified in February 2024, has  increased from 3.1 cm to 3.5 cm over nine months, raising suspicion for malignancy. Surgical intervention is deferred due to liver disease and associated risks. -f/u with Dr. Selma   Plan - Lab reviewed, he has mild anemia and thrombocytopenia are likely related to his liver cirrhosis - Will refer him to GI Dr. Rosalie, who did his EGD in 2019 - Labs today, I will call him with lab results next week -I called in furosemide  and spironolactone  for his ascites and leg edema.     Orders Placed This Encounter  Procedures   CBC with Differential (Cancer Center Only)    Standing Status:   Future    Number of Occurrences:   1    Expected Date:   07/29/2024    Expiration Date:   07/29/2025   Ferritin    Standing Status:   Future    Number of Occurrences:   1    Expected Date:   07/29/2024    Expiration Date:   07/29/2025   Iron and Iron Binding Capacity ([facility] only)    Standing Status:   Future    Number of Occurrences:   1    Expected Date:   07/29/2024    Expiration Date:   07/29/2025   Retic Panel    Standing Status:   Future    Number of Occurrences:   1    Expected Date:   07/29/2024    Expiration Date:   07/29/2025   Vitamin B12    Standing Status:   Future    Number of Occurrences:   1    Expected Date:   07/29/2024    Expiration Date:   07/29/2025   Folate, Serum    Standing Status:   Future    Number of Occurrences:   1    Expected Date:   07/29/2024    Expiration Date:   07/29/2025   Immature Platelet Fraction    Standing Status:   Future    Number of Occurrences:   1    Expected Date:   07/29/2024    Expiration Date:   07/29/2025   CMP (Cancer Center only)    Standing Status:   Future    Number of Occurrences:   1    Expected Date:   07/29/2024    Expiration Date:   07/29/2025   Protime-INR    Standing Status:   Future    Number of Occurrences:   1    Expected Date:   07/29/2024    Expiration Date:   07/29/2025   Ambulatory referral to Gastroenterology     Referral Priority:   Routine    Referral Type:   Consultation  Referral Reason:   Specialty Services Required    Number of Visits Requested:   1    All questions were answered. The patient knows to call the clinic with any problems, questions or concerns. I spent 30 minutes counseling the patient face to face. The total time spent in the appointment was 40 minutes including review of chart and various tests results, discussions about plan of care and coordination of care plan.     Onita Mattock, MD 07/29/2024 3:48 PM

## 2024-08-07 ENCOUNTER — Inpatient Hospital Stay: Payer: Self-pay | Attending: Hematology | Admitting: Hematology

## 2024-08-07 ENCOUNTER — Telehealth: Payer: Self-pay | Admitting: Hematology

## 2024-08-07 DIAGNOSIS — R188 Other ascites: Secondary | ICD-10-CM | POA: Insufficient documentation

## 2024-08-07 DIAGNOSIS — K7460 Unspecified cirrhosis of liver: Secondary | ICD-10-CM | POA: Insufficient documentation

## 2024-08-07 DIAGNOSIS — D696 Thrombocytopenia, unspecified: Secondary | ICD-10-CM | POA: Insufficient documentation

## 2024-08-07 DIAGNOSIS — D500 Iron deficiency anemia secondary to blood loss (chronic): Secondary | ICD-10-CM

## 2024-08-07 DIAGNOSIS — D6959 Other secondary thrombocytopenia: Secondary | ICD-10-CM | POA: Insufficient documentation

## 2024-08-07 DIAGNOSIS — D509 Iron deficiency anemia, unspecified: Secondary | ICD-10-CM | POA: Insufficient documentation

## 2024-08-07 MED ORDER — FERROUS SULFATE 325 (65 FE) MG PO TBEC: 325.0000 mg | DELAYED_RELEASE_TABLET | Freq: Three times a day (TID) | ORAL | 3 refills | Status: AC

## 2024-08-07 NOTE — Telephone Encounter (Signed)
 LVM  regarding appt and  mailed letter

## 2024-08-07 NOTE — Progress Notes (Signed)
 [facility]   [phone] [phone]) [phone]   Clinic Follow up Note   Patient Care Team: [facility], [facility] as PCP - General (Internal Medicine) 08/07/2024  I connected with Rodney Russell on 08/07/24 at  3:40 PM EST by telephone and verified that I am speaking with the correct person using two identifiers.   I discussed the limitations, risks, security and privacy concerns of performing an evaluation and management service by telephone and the availability of in person appointments. I also discussed with the patient that there may be a patient responsible charge related to this service. The patient expressed understanding and agreed to proceed.   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 Thrombocytopenia likely related to liver cirrhosis. - Recommended over-the-counter iron supplementation. - Advised taking iron pills after meals to reduce gastrointestinal discomfort. - Instructed to monitor for constipation and manage with increased water intake, dietary fiber, and possibly stool softeners or laxatives like Miralax if needed. - Referred to GI specialist for further evaluation of thrombocytopenia related to liver cirrhosis.  Liver cirrhosis with ascites Liver cirrhosis with associated ascites. Current management includes diuretics. No significant improvement in swelling reported. - Continue current diuretic regimen. - Referred to GI specialist for further management of liver cirrhosis. - Advised to contact GI specialist if not contacted by him.  Plan - I reviewed his lab results and suggested him to start taking over-the-counter iron pill, I called in ferrous sulfate  for him today. - I have messaged Dr. Rosalie for his follow-up appointment and  endoscopy - Lab and follow-up in 6 months.   Discussed the use of AI scribe software for clinical note transcription with the patient, who gave verbal consent to proceed.  History of Present Illness Rodney Russell is a 63 year old male with anemia and thrombocytopenia who presents for review of workup results.  He has had no new systemic symptoms since his last visit. He denies bleeding. For the past four weeks he has had soreness and a firm area behind his nipples following a CT scan. He also notes fluid retention in his ankles that improves in the morning after showering.  He drinks about ten to twelve glasses of water daily. He has been referred to gastroenterology for further evaluation of anemia but has not yet been contacted.  He has liver disease and takes spironolactone  once daily. He takes two [facility] powders daily for joint pain in his knees and shoulders.     REVIEW OF SYSTEMS:   Constitutional: Denies fevers, chills or abnormal weight loss Eyes: Denies blurriness of vision Ears, nose, mouth, throat, and face: Denies mucositis or sore throat Respiratory: Denies cough, dyspnea or wheezes Cardiovascular: Denies palpitation, chest discomfort or lower extremity swelling Gastrointestinal:  Denies nausea, heartburn or change in bowel habits Skin: Denies abnormal skin rashes Lymphatics: Denies new lymphadenopathy or easy bruising Neurological:Denies numbness, tingling or new weaknesses Behavioral/Psych: Mood is stable, no new changes  All other systems were reviewed with the patient and are negative.  MEDICAL HISTORY:  Past Medical History:  Diagnosis Date   GERD (gastroesophageal reflux disease)    Liver cirrhosis (HCC)     SURGICAL HISTORY: Past Surgical History:  Procedure Laterality Date   BACK SURGERY     CORONARY ANGIOPLASTY     ESOPHAGEAL DILATION  x 2   ESOPHAGOGASTRODUODENOSCOPY N/A 12/22/2018   Procedure: ESOPHAGOGASTRODUODENOSCOPY (EGD);  Surgeon: Unk Corinn Skiff, MD;  Location: [facility] ENDOSCOPY;  Service: Gastroenterology;  Laterality: N/A;   ESOPHAGOGASTRODUODENOSCOPY (EGD) WITH PROPOFOL  N/A 11/12/2017   Procedure: ESOPHAGOGASTRODUODENOSCOPY (EGD) WITH PROPOFOL ;  Surgeon: Rosalie Kitchens, MD;  Location: [facility] ENDOSCOPY;  Service: Endoscopy;  Laterality: N/A;    I have reviewed the social history and family history with the patient and they are unchanged from previous note.  ALLERGIES:  has no known allergies.  MEDICATIONS:  Current Outpatient Medications  Medication Sig Dispense Refill   ferrous sulfate 325 (65 FE) MG EC tablet Take 1 tablet (325 mg total) by mouth 3 (three) times daily with meals. 30 tablet 3   furosemide  (LASIX ) 40 MG tablet Take 1 tablet (40 mg total) by mouth daily. 30 tablet 0   meloxicam (MOBIC) 15 MG tablet Take 15 mg by mouth daily as needed for pain. (Patient not taking: Reported on 07/29/2024)     Multiple Vitamin (MULTIVITAMIN WITH MINERALS) TABS tablet Take 1 tablet by mouth daily.     pantoprazole (PROTONIX) 40 MG tablet Take 40 mg by mouth every other day. (Patient not taking: Reported on 07/29/2024)     spironolactone  (ALDACTONE ) 25 MG tablet Take 1 tablet (25 mg total) by mouth daily. 30 tablet 0   No current facility-administered medications for this visit.    PHYSICAL EXAMINATION: Not performed   LABORATORY DATA:  I have reviewed the data as listed    Latest Ref Rng & Units 07/29/2024    3:00 PM 04/30/2024   11:33 AM 10/19/2022    1:20 AM  CBC  WBC 4.0 - 10.5 K/uL 7.1  7.9  8.7   Hemoglobin 13.0 - 17.0 g/dL 87.9  87.1  84.2   Hematocrit 39.0 - 52.0 % 35.4  38.3  45.4   Platelets 150 - 400 K/uL 145  167  125         Latest Ref Rng & Units 07/29/2024    3:00 PM 04/30/2024    7:33 PM 04/30/2024   11:33 AM  CMP  Glucose 70 - 99 mg/dL 94   805   BUN 8 - 23 mg/dL 11   5   Creatinine 9.38 - 1.24 mg/dL 9.05   8.97   Sodium 864 - 145 mmol/L 137   132   Potassium 3.5 - 5.1 mmol/L 3.9   2.9    Chloride 98 - 111 mmol/L 105   95   CO2 22 - 32 mmol/L 22   23   Calcium 8.9 - 10.3 mg/dL 9.1   7.9   Total Protein 6.5 - 8.1 g/dL 7.3  6.7    Total Bilirubin 0.0 - 1.2 mg/dL 1.4  2.6    Alkaline Phos 38 - 126 U/L 128  93    AST 15 - 41 U/L 43  53    ALT 0 - 44 U/L 23  21        RADIOGRAPHIC STUDIES: I have personally reviewed the radiological images as listed and agreed with the findings in the report. No results found.     I discussed the assessment and treatment plan with the patient. The patient was provided an opportunity to ask questions and all were answered. The patient agreed with the plan and demonstrated an understanding of the instructions.   The patient was advised to call back or seek an in-person evaluation if the symptoms worsen or if the condition fails  to improve as anticipated.  I provided 15 minutes of non face-to-face telephone visit time during this encounter, including review of chart and various tests results, discussions about plan of care and coordination of care plan.    Onita Mattock, MD 08/07/24

## 2025-02-05 ENCOUNTER — Inpatient Hospital Stay: Payer: Self-pay

## 2025-02-05 ENCOUNTER — Inpatient Hospital Stay: Payer: Self-pay | Admitting: Hematology
